# Patient Record
Sex: Male | Born: 1980
Health system: Southern US, Community
[De-identification: ages and names within clinical notes are randomized; demographics above are authoritative.]

## PROBLEM LIST (undated history)

## (undated) DIAGNOSIS — R55 Syncope and collapse: Secondary | ICD-10-CM

## (undated) DIAGNOSIS — F419 Anxiety disorder, unspecified: Secondary | ICD-10-CM

## (undated) DIAGNOSIS — E785 Hyperlipidemia, unspecified: Secondary | ICD-10-CM

## (undated) DIAGNOSIS — I319 Disease of pericardium, unspecified: Secondary | ICD-10-CM

## (undated) HISTORY — DX: Syncope and collapse: R55

## (undated) HISTORY — DX: Disease of pericardium, unspecified: I31.9

## (undated) HISTORY — DX: Anxiety disorder, unspecified: F41.9

## (undated) HISTORY — DX: Hyperlipidemia, unspecified: E78.5

## (undated) HISTORY — PX: TONSILLECTOMY: SUR1361

---
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
## 2017-04-29 ENCOUNTER — Other Ambulatory Visit: Payer: Self-pay

## 2017-04-29 ENCOUNTER — Emergency Department (INDEPENDENT_AMBULATORY_CARE_PROVIDER_SITE_OTHER)
Admission: EM | Admit: 2017-04-29 | Discharge: 2017-04-29 | Disposition: A | Discharge status: Home / Self Care | Payer: 59 | Source: Home / Self Care | Attending: Family Medicine | Admitting: Family Medicine

## 2017-04-29 ENCOUNTER — Encounter: Payer: Self-pay | Admitting: Emergency Medicine

## 2017-04-29 DIAGNOSIS — H5711 Ocular pain, right eye: Secondary | ICD-10-CM

## 2017-04-29 MED ORDER — AMOXICILLIN-POT CLAVULANATE 875-125 MG PO TABS: 1.0000 | ORAL_TABLET | Freq: Two times a day (BID) | ORAL | 0 refills | Status: DC

## 2017-04-29 NOTE — Discharge Instructions (Signed)
If eyelid swelling recurs, apply warm compress 2 to 3 times daily.  If swelling increases, begin Augmentin.

## 2017-04-29 NOTE — ED Triage Notes (Signed)
Right eye swollen and tender x 2 days, slight sore throat 2 days ago, better only scratchy now.

## 2017-04-29 NOTE — ED Provider Notes (Signed)
[facility]    CSN: [id number] Arrival date & time: 04/29/17  1203     History   Chief Complaint Chief Complaint  Patient presents with  . Eye Problem    HPI Jeremy Peck is a 36 y.o. male.   Two days ago patient developed fatigue, right headache, scratchy throat, and mild redness of his right eyelid.  No sinus congestion or cough.  The next morning his right eyelids were swollen, now resolved.  He denies changes in vision or foreign body sensation.  All symptoms now improved.   The history is provided by the patient.    History reviewed. No pertinent past medical history.  There are no active problems to display for this patient.   History reviewed. No pertinent surgical history.     Home Medications    Prior to Admission medications   Medication Sig Start Date End Date Taking? Authorizing Provider  cetirizine (ZYRTEC) 10 MG tablet Take 10 mg daily by mouth.   Yes [provider]  amoxicillin-clavulanate (AUGMENTIN) 875-125 MG tablet Take 1 tablet 2 (two) times daily by mouth. Take with food (Rx void after 05/07/17) 04/29/17   Cathren HarshBeese, Tera MaterStephen A, MD    Family History History reviewed. No pertinent family history.  Social History Social History   Tobacco Use  . Smoking status: Never Smoker  . Smokeless tobacco: Never Used  Substance Use Topics  . Alcohol use: Yes  . Drug use: No     Allergies   Patient has no known allergies.   Review of Systems Review of Systems   Physical Exam Triage Vital Signs ED Triage Vitals  Enc Vitals Group     BP 04/29/17 1300 128/85     Pulse Rate 04/29/17 1300 (!) 58     Resp --      Temp 04/29/17 1300 98.5 F (36.9 C)     Temp Source 04/29/17 1300 Oral     SpO2 04/29/17 1300 100 %     Weight 04/29/17 1301 197 lb (89.4 kg)     Height 04/29/17 1301 6\' 2"  (1.88 m)     Head Circumference --      Peak Flow --      Pain Score 04/29/17 1301 1     Pain Loc --      Pain Edu? --      Excl. in  GC? --    No data found.  Updated Vital Signs BP 128/85 (BP Location: Right Arm)   Pulse (!) 58   Temp 98.5 F (36.9 C) (Oral)   Ht 6\' 2"  (1.88 m)   Wt 197 lb (89.4 kg)   SpO2 100%   BMI 25.29 kg/m   Visual Acuity Right Eye Distance:   Left Eye Distance:   Bilateral Distance:    Right Eye Near:   Left Eye Near:    Bilateral Near:     Physical Exam Nursing notes and Vital Signs reviewed. Appearance:  Patient appears stated age, and in no acute distress Eyes:  Pupils are equal, round, and reactive to light and accomodation.  Extraocular movement is intact.  Conjunctivae are not inflamed.  No right lid swelling, erythema, or tenderness to palpation.  No right eye discharge.  Fundi benign.  Ears:  Canals normal.  Tympanic membranes normal.  Nose:  Normal turbinates.  No sinus tenderness.   Pharynx:  Normal Neck:  Supple. No adenopathy.   Lungs: Normal respiration. Heart:  Normal rate. Skin:  No rash  present.    UC Treatments / Results  Labs (all labs ordered are listed, but only abnormal results are displayed) Labs Reviewed - No data to display  EKG  EKG Interpretation None       Radiology No results found.  Procedures Procedures (including critical care time)  Medications Ordered in UC Medications - No data to display   Initial Impression / Assessment and Plan / UC Course  I have reviewed the triage vital signs and the nursing notes.  Pertinent labs & imaging results that were available during my care of the patient were reviewed by me and considered in my medical decision making (see chart for details).    Patient's symptoms appear to have improved significantly since onset. No evidence preseptal cellulitis today (?early viral URI) If eyelid swelling recurs, apply warm compress 2 to 3 times daily.  If swelling increases, begin Augmentin (given Rx to hold). Followup with ophthalmologist if not improving one week.    Final Clinical Impressions(s) / UC  Diagnoses   Final diagnoses:  Eye pain, right    ED Discharge Orders        Ordered    amoxicillin-clavulanate (AUGMENTIN) 875-125 MG tablet  2 times daily     04/29/17 1406          Lattie HawBeese, Bridgit Eynon A, MD 05/02/17 1324

## 2017-07-01 ENCOUNTER — Encounter: Payer: Self-pay | Admitting: Physician Assistant

## 2017-07-01 ENCOUNTER — Encounter (INDEPENDENT_AMBULATORY_CARE_PROVIDER_SITE_OTHER): Payer: Self-pay

## 2017-07-01 ENCOUNTER — Ambulatory Visit (INDEPENDENT_AMBULATORY_CARE_PROVIDER_SITE_OTHER): Payer: 59 | Admitting: Physician Assistant

## 2017-07-01 VITALS — BP 121/85 | HR 72 | Ht 74.0 in | Wt 203.0 lb

## 2017-07-01 DIAGNOSIS — F909 Attention-deficit hyperactivity disorder, unspecified type: Secondary | ICD-10-CM

## 2017-07-01 DIAGNOSIS — Z7689 Persons encountering health services in other specified circumstances: Secondary | ICD-10-CM | POA: Diagnosis not present

## 2017-07-01 DIAGNOSIS — F4010 Social phobia, unspecified: Secondary | ICD-10-CM

## 2017-07-01 DIAGNOSIS — F40243 Fear of flying: Secondary | ICD-10-CM | POA: Diagnosis not present

## 2017-07-01 MED ORDER — ALPRAZOLAM 1 MG PO TABS: ORAL_TABLET | ORAL | 0 refills | Status: DC

## 2017-07-01 MED ORDER — METHYLPHENIDATE HCL 10 MG PO TABS: 10.0000 mg | ORAL_TABLET | Freq: Two times a day (BID) | ORAL | 0 refills | Status: DC

## 2017-07-01 NOTE — Progress Notes (Signed)
HPI:                                                                Jeremy Peck is a 37 y.o. male who presents to [facility]: Primary [facility] Medicine today to establish care  Current concerns include: anxiety, inattention  Anxiety: patient reports long-standing history of "social anxiety" that he has managed with meditation and relaxation exercises. 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 He also has a phobia of flying and has an upcoming flight to [place]. Reports history of panic attacks.   GAD 7 : Generalized Anxiety Score 07/01/2017  Nervous, Anxious, on Edge 2  Control/stop worrying 3  Worry too much - different things 3  Trouble relaxing 1  Restless 2  Easily annoyed or irritable 3  Afraid - awful might happen 2  Total GAD 7 Score 16  Anxiety Difficulty Very difficult    Depression screen PHQ 2/9 07/01/2017  Decreased Interest 1  Down, Depressed, Hopeless 0  PHQ - 2 Score 1   ADHD: reports he was diagnosed around age 37 by his college psychologist. He used to be prescribed Adderall, but states he had insomnia and weight loss. He thinks he also tried Wellbutrin, but there was concern about possible hx of seizure. He has not taken medication since college. He states his anxiety symptoms were better managed when he was on Adderall.    Adult ADHD Self Report Scale (most recent)    Adult ADHD Self-Report Scale (ASRS-v1.1) Symptom Checklist - 07/01/17 1540      Part A   1. How often do you have trouble wrapping up the final details of a project, once the challenging parts have been done?  Very Often  (Abnormal)   2. How often do you have difficulty getting things done in order when you have to do a task that requires organization?  Sometimes  (Abnormal)     3. How often do you have problems  remembering appointments or obligations?  Sometimes  (Abnormal)   4. When you have a task that requires a lot of thought, how often do you avoid or delay getting started?  Sometimes    5. How often do you fidget or squirm with your hands or feet when you have to sit down for a long time?  Very Often  (Abnormal)   6. How often do you feel overly active and compelled to do things, like you were driven by a motor?  Often  (Abnormal)       Part B   7. How often do you make careless mistakes when you have to work on a boring or difficult project?  Often  (Abnormal)   8. How often do you have difficulty keeping your attention when you are doing boring or repetitive work?  Sometimes    9. How often do you have difficulty concentrating on what people say to you, even when they are speaking to you directly?  Often  (Abnormal)   10. How often do you misplace or have difficulty finding things at home or at work?  Sometimes  11. How often are you distracted by activity or noise around you?  Often  (Abnormal)   12. How often do you leave your seat in meetings or other situations in which you are expected to remain seated?  Sometimes  (Abnormal)     13. How often do you feel restless or fidgety?  Very Often  (Abnormal)   14. How often do you have difficulty unwinding and relaxing when you have time to yourself?  Sometimes    15. How often do you find yourself talking too much when you are in social situations?  Never  16. When you are in a conversation, how often do you find yourself finishing the sentences of the people you are talking to, before they can finish them themselves?  Sometimes  (Abnormal)     17. How often do you have difficulty waiting your turn in situations when turn taking is required?  Never  18. How often do you interrupt others when they are busy?  Never      Comment   How old were you when these problems first began to occur?  18         No flowsheet data found.    No past medical  history on file. No past surgical history on file. Social History   Tobacco Use  . Smoking status: Never Smoker  . Smokeless tobacco: Never Used  Substance Use Topics  . Alcohol use: Yes   family history is not on file.    ROS: Review of Systems  Psychiatric/Behavioral: Negative for depression, hallucinations, substance abuse and suicidal ideas. The patient is nervous/anxious.   All other systems reviewed and are negative.    Medications: No current outpatient medications on file.   No current facility-administered medications for this visit.    No Known Allergies     Objective:  BP 121/85   Pulse 72   Ht 6\' 2"  (1.88 m)   Wt 203 lb (92.1 kg)   BMI 26.06 kg/m  Gen:  alert, not ill-appearing, no distress, appropriate for age HEENT: head normocephalic without obvious abnormality, conjunctiva and cornea clear, trachea midline Pulm: Normal work of breathing, normal phonation Neuro: alert and oriented x 3, no tremor MSK: extremities atraumatic, normal gait and station Skin: intact, no rashes on exposed skin, no jaundice, no cyanosis Psych: well-groomed, cooperative, good eye contact, euthymic mood, anxious affect, speech is articulate, and thought processes clear and goal-directed    No results found for this or any previous visit (from the past 72 hour(s)). No results found.    Assessment and Plan: 37 y.o. male with   1. Encounter to establish care - reviewed PMH, PSH, PFH, medications and allergies - reviewed health maintenance - influenza and Tdap UTD per patient - negative PHQ2  2. Adult ADHD - ASRS score 17, starting low-dose Methylphenidate bid with plan to switch to Concerta once we identify TDD - methylphenidate (RITALIN) 10 MG tablet; Take 1 tablet (10 mg total) by mouth 2 (two) times daily.  Dispense: 60 tablet; Refill: 0  3. Flying phobia - ALPRAZolam (XANAX) 1 MG tablet; Take 1 tab PO 1 hour before flight as needed  Dispense: 10 tablet; Refill:  0  4. Social anxiety disorder - GAD7 score 16, severe - patient is reticent about a daily medication. We discussed benefits of CBT. Patient will check [email or web address] and we will provide a referral to counselor if needed   Patient education and anticipatory guidance given Patient agrees with treatment  plan Follow-up in 4 weeks for anxiety/ADHD as needed if symptoms worsen or fail to improve  I spent 25 minutes with this patient, greater than 50% was face-to-face time counseling regarding the above diagnoses  Levonne Hubertharley E. Sharron Petruska PA-C

## 2017-07-01 NOTE — Patient Instructions (Signed)
[email or web address]    Cognitive Behavioral Therapy Cognitive behavioral therapy (CBT) is a short-term, goal-oriented type of talk therapy. CBT can help you:  Identify patterns of thinking, feeling, and behaving that are causing you problems.  Decide how you want to think, feel, and respond to life events.  Set goals to change the beliefs and thoughts that cause you to act in ways that are not helpful for you.  Follow up on the changes that you make.  What are the different types of CBT? The different types of CBT include:  Dialectical behavioral therapy (DBT). This approach is often used in group therapy, and it aids a person in managing behavior by focusing on: ? Things that cause problems to start (triggers). ? Methods of self-calming. ? Re-evaluating thinking processes.  Mindfulness-based cognitive therapy. This approach involves focusing your attention, meditating, and developing awareness of the present moment (mindfulness).  Rational emotive behavior therapy. This approach uses rational thought to reframe your thinking so it is less judgmental. Your therapist may directly challenge your thought processes.  Stress inoculation training. This approach involves planning ahead for stressful situations by practicing new thoughts and behaviors. This planning can help you avoid going back to old actions.  Acceptance and commitment therapy (ACT). This approach focuses on accepting yourself as you are and practicing mindfulness. It helps you understand what you would like to change and how you can set goals in that direction.  What conditions is CBT used to treat? CBT may help to treat:  Mental health conditions, including: ? Depression. ? Anxiety. ? Bipolar disorder. ? Eating disorders. ? Post-traumatic stress disorder (PTSD). ? Obsessive-compulsive disorder (OCD).  Insomnia and other sleep disorders.  Pain.  Stress.  Coping with loss or grief.  Coping with a difficult  medical diagnosis or illness.  Relationship problems.  Emotional distress or shock (trauma).  How can CBT help me? CBT may:  Give you a chance to share your thoughts, feelings, problems, and fears in a safe space.  Help you focus on specific problems.  Give you homework that helps you put theory into practice. Homework may include keeping a journal or doing thinking exercises.  Help you become aware of your patterns of thinking, feeling, and behaving, and how those three patterns affect each other.  Change your thoughts so that you can change your behaviors.  Help you chose how you want to view the world.  Teach you planned coping skills and offer better ways to deal with stress and difficult situations.  To make the most of CBT, make sure you:  Find a licensed therapist whom you trust.  Take an active part in your therapy and do the homework that you are given.  Are honest about your problems.  Avoid skipping your therapy sessions.  Summary  Cognitive behavioral therapy (CBT) is a short-term, goal-oriented type of talk therapy.  CBT can help you become aware of your patterns and the relationships among your thoughts, feelings, and behavior.  CBT may help mental health conditions and other problems. This information is not intended to replace advice given to you by your health care provider. Make sure you discuss any questions you have with your health care provider. Document Released: 10/09/2016 Document Revised: 10/09/2016 Document Reviewed: 10/09/2016 Elsevier Interactive Patient Education  2018 [employer]  Social Anxiety Disorder, Adult Social anxiety disorder, previously called social phobia, is a mental disorder. People with social anxiety disorder often feel nervous, afraid, or embarrassed when they are around other people  in social situations. They worry that other people are judging or criticizing them for how they look, what they say, or how they  act. Social anxiety disorder is more than just occasional shyness or self-consciousness. It can cause severe emotional distress. It can interfere with daily life activities. Social anxiety disorder also may lead to alcohol or drug use and even suicide. Social anxiety disorder is a common mental disorder. It can develop at any time, but it usually starts in the teenage years. What are the causes? The cause of this condition is not known. It may involve genes that are passed through families. Stressful events may trigger anxiety. What increases the risk? This condition is more likely to develop in:  People who have a family history of anxiety disorders.  Women.  People who have a condition that makes them feel self-conscious or nervous, such as a stutter or a chronic disease.  What are the signs or symptoms? The main symptom of this condition is fear of being criticized or judged in social situations. You may be afraid to:  Speak in public.  Go shopping.  Use a public bathroom.  Eat at a restaurant.  Go to work.  Interact with unfamiliar people.  Extreme fear and anxiety may cause physical symptoms, including:  Blushing.  Racing heart.  Sweating.  Shaky hands or voice.  Confusion.  Light-headedness.  Upset stomach, diarrhea, or vomiting.  Shortness of breath.  How is this diagnosed? Your health care provider can diagnose this condition based on your history, symptoms, and behavior in social situations. Your health care provider may ask you about your use of alcohol or drugs, including prescription medicines. Your health care provider may refer you to a mental health specialist for further evaluation or treatment. How is this treated? Treatment for this condition may include:  Cognitive behavioral therapy. This type of talk therapy helps you learn to replace negative thoughts and behaviors with positive ones. This may include learning better coping skills and ways to  control anxiety.  Exposure therapy. You will be exposed to social situations that cause fear. The treatment starts with situations that you can manage. Over time, you will learn to manage harder situations.  Antidepressant medicines. These medicines may be used for a short time along with other therapies.  Beta blockers. These medicines may help to control anxiety.  Biofeedback. This process trains you to manage your body's response (physiological response) through breathing techniques and relaxation methods. You will work with a therapist while machines are used to monitor your physical symptoms.  Relaxation and coping techniques. These include deep breathing, self-talk, meditation, visual imagery, and yoga. Relaxation techniques help to keep you calm in social situations.  These treatments are often used in combination. Follow these instructions at home:  Take over-the-counter and prescription medicines only as told by your health care provider.  Practice relaxation and coping strategies as taught by your health care provider.  Return to social activities as suggested by your health care provider.  Keep all follow-up visits as told by your health care provider. This is important. Contact a health care provider if:  Your symptoms do not improve.  Your symptoms get worse.  You have signs of depression, such as: ? A persistently sad, cranky, or irritable mood. ? Loss of enjoyment in activities that used to bring you joy. ? Change in weight or eating. ? Changes in sleeping habits. ? Avoiding friends or family members. ? Loss of energy for normal tasks. ?  Feelings of guilt or worthlessness.  You become very isolated.  You find it very hard to speak or interact with others.  You are using drugs.  You are drinking more alcohol than normal. Get help right away if:  You self-harm.  You have suicidal thoughts. If you ever feel like you may hurt yourself or others, or have  thoughts about taking your own life, get help right away. You can go to your nearest emergency department or call:  Your local emergency services (911 in the [place]  A suicide crisis helpline, such as the National Suicide Prevention Lifeline at 774-543-3053. This is open 24 hours a day.  Summary  Social anxiety disorder may cause you to feel nervous, afraid, or embarrassed when you are around other people in social situations.  Social anxiety disorder is a common mental disorder. It can develop at any time, but it usually starts in the teenage years.  Treatment includes talk therapy, exposure therapy, medicines, biofeedback, relaxation techniques, or a combination of two or more treatments. This information is not intended to replace advice given to you by your health care provider. Make sure you discuss any questions you have with your health care provider. Document Released: 04/26/2005 Document Revised: 04/20/2016 Document Reviewed: 04/20/2016 Elsevier Interactive Patient Education  2018 [employer].

## 2017-08-05 ENCOUNTER — Encounter: Payer: Self-pay | Admitting: Physician Assistant

## 2017-08-05 ENCOUNTER — Ambulatory Visit (INDEPENDENT_AMBULATORY_CARE_PROVIDER_SITE_OTHER): Payer: 59 | Admitting: Physician Assistant

## 2017-08-05 VITALS — BP 126/85 | HR 78 | Wt 200.0 lb

## 2017-08-05 DIAGNOSIS — Z79899 Other long term (current) drug therapy: Secondary | ICD-10-CM

## 2017-08-05 DIAGNOSIS — Z131 Encounter for screening for diabetes mellitus: Secondary | ICD-10-CM | POA: Diagnosis not present

## 2017-08-05 DIAGNOSIS — Z1329 Encounter for screening for other suspected endocrine disorder: Secondary | ICD-10-CM

## 2017-08-05 DIAGNOSIS — Z13220 Encounter for screening for lipoid disorders: Secondary | ICD-10-CM | POA: Diagnosis not present

## 2017-08-05 DIAGNOSIS — I10 Essential (primary) hypertension: Secondary | ICD-10-CM

## 2017-08-05 DIAGNOSIS — F909 Attention-deficit hyperactivity disorder, unspecified type: Secondary | ICD-10-CM | POA: Diagnosis not present

## 2017-08-05 MED ORDER — METHYLPHENIDATE HCL ER 27 MG PO TB24: 27.0000 mg | ORAL_TABLET | Freq: Every day | ORAL | 0 refills | Status: DC

## 2017-08-05 NOTE — Progress Notes (Signed)
HPI:                                                                Jeremy Peck is a 37 y.o. male who presents to [facility]: Primary Care Sports Medicine today for ADHD/anxiety follow-up  ADHD: taking low-dose Methylphenidate 10 mg daily. He has noticed it only lasts about a half day. He will take a second afternoon dose on weekends when he is working 12+ hours. He feels his social anxiety has improved significantly since starting the stimulant. Denies headache, palpitations, insomnia, weight loss.    Depression screen PHQ 2/9 07/01/2017  Decreased Interest 1  Down, Depressed, Hopeless 0  PHQ - 2 Score 1    GAD 7 : Generalized Anxiety Score 07/01/2017  Nervous, Anxious, on Edge 2  Control/stop worrying 3  Worry too much - different things 3  Trouble relaxing 1  Restless 2  Easily annoyed or irritable 3  Afraid - awful might happen 2  Total GAD 7 Score 16  Anxiety Difficulty Very difficult      Past Medical History:  Diagnosis Date  . Anxiety   . Hypertension   . Pericarditis   . Syncope and collapse 2001   ? seizure   Past Surgical History:  Procedure Laterality Date  . TONSILLECTOMY     Social History   Tobacco Use  . Smoking status: Never Smoker  . Smokeless tobacco: Never Used  Substance Use Topics  . Alcohol use: Yes    Alcohol/week: 1.2 oz    Types: 2 Standard drinks or equivalent per week   family history includes Throat cancer in his father.    ROS: negative except as noted in the HPI  Medications: Current Outpatient Medications  Medication Sig Dispense Refill  . Cholecalciferol ([employer] VIT D 5000 HIGH-POTENCY) 5000 units capsule Take 5,000 Units by mouth daily.    Marland Kitchen CREATINE PO Take by mouth.    . methylphenidate 27 MG PO TB24 Take 1 tablet (27 mg total) by mouth daily with breakfast. 30 tablet 0   No current facility-administered medications for this visit.    No Known Allergies     Objective:  BP 126/85   Pulse 78    Wt 200 lb (90.7 kg)   BMI 25.68 kg/m  Gen:  alert, not ill-appearing, no distress, appropriate for age HEENT: head normocephalic without obvious abnormality, conjunctiva and cornea clear, trachea midline Pulm: Normal work of breathing, normal phonation, clear to auscultation bilaterally CV: Normal rate, regular rhythm, s1 and s2 distinct, no murmurs, clicks or rubs  Neuro: alert and oriented x 3, no tremor MSK: extremities atraumatic, normal gait and station Skin: intact, no rashes on exposed skin, no jaundice, no cyanosis Psych: well-groomed, cooperative, good eye contact, euthymic mood, affect mood-congruent, speech is articulate, and thought processes clear and goal-directed    No results found for this or any previous visit (from the past 72 hour(s)). No results found.    Assessment and Plan: 37 y.o. male with   1. Encounter for medication management   2. Adult ADHD - tolerating low-dose methylphenidate IR. He would like to try extended release. Counseled on monitoring BP's at home - methylphenidate 27 MG PO TB24; Take 1 tablet (27 mg total) by  mouth daily with breakfast.  Dispense: 30 tablet; Refill: 0  3. Hypertension goal BP (blood pressure) < 130/80 BP Readings from Last 3 Encounters:  08/05/17 126/85  07/01/17 121/85  04/29/17 128/85  - he has no additional CVD risk factors - counseled on therapeutic lifestyle changes - he will monitor BP's at home - active surveillance - Comprehensive metabolic panel, TSH pending  4. Screening for lipid disorders - Lipid Panel w/reflex Direct LDL  5. Screening for diabetes mellitus - Comprehensive metabolic panel  6. Screening for thyroid disorder - TSH + free T4  Patient education and anticipatory guidance given Patient agrees with treatment plan Follow-up in 3 months for medication management or sooner as needed if symptoms worsen or fail to improve  Levonne Hubertharley E. Cummings PA-C

## 2017-08-05 NOTE — Patient Instructions (Addendum)
For your blood pressure: - Goal <130/80 - monitor and log blood pressures at home - check around the same time each day in a relaxed setting - Limit salt to <2000 mg/day - Follow DASH eating plan - limit alcohol to 2 standard drinks per day for men and 1 per day for women - avoid tobacco products - weight loss: 7% of current body weight - follow-up every 6 months for your blood pressure   I have also ordered fasting labs. The lab is a walk-in open M-F 7:30a-4:30p (closed 12:30-1:30p). Nothing to eat or drink after midnight or at least 8 hours before your blood draw. You can have water and your medications.

## 2017-08-08 ENCOUNTER — Telehealth: Payer: Self-pay | Admitting: Physician Assistant

## 2017-08-08 DIAGNOSIS — Z131 Encounter for screening for diabetes mellitus: Secondary | ICD-10-CM | POA: Diagnosis not present

## 2017-08-08 DIAGNOSIS — Z13220 Encounter for screening for lipoid disorders: Secondary | ICD-10-CM | POA: Diagnosis not present

## 2017-08-08 DIAGNOSIS — Z1329 Encounter for screening for other suspected endocrine disorder: Secondary | ICD-10-CM | POA: Diagnosis not present

## 2017-08-08 DIAGNOSIS — I10 Essential (primary) hypertension: Secondary | ICD-10-CM | POA: Diagnosis not present

## 2017-08-08 NOTE — Telephone Encounter (Signed)
Received fax from the pharmacy that Concerta 27 MG was approved from 08/07/2017 until 08/06/2018. Pharmacy notified. Patient notified as well. Forms sent to scan.   Per Genevie CheshireBilly at [facility] patient was quoted the estimate and he is willing to pay the difference that insurance did not pay at this time. Patient reports he may want to switch pharmacies in the future.   Reference: [id number].

## 2017-08-09 ENCOUNTER — Other Ambulatory Visit: Payer: Self-pay | Admitting: Physician Assistant

## 2017-08-09 ENCOUNTER — Encounter: Payer: Self-pay | Admitting: Physician Assistant

## 2017-08-09 DIAGNOSIS — E782 Mixed hyperlipidemia: Secondary | ICD-10-CM

## 2017-08-09 LAB — LIPID PANEL W/REFLEX DIRECT LDL
Cholesterol: 239 mg/dL — ABNORMAL HIGH (ref <200)
HDL: 38 mg/dL — ABNORMAL LOW (ref >40)
LDL Cholesterol (Calc): 174 mg/dL (calc) — ABNORMAL HIGH
Non-HDL Cholesterol (Calc): 201 mg/dL (calc) — ABNORMAL HIGH (ref <130)
TRIGLYCERIDES: 135 mg/dL (ref <150)
Total CHOL/HDL Ratio: 6.3 (calc) — ABNORMAL HIGH (ref <5.0)

## 2017-08-09 LAB — COMPREHENSIVE METABOLIC PANEL
AG RATIO: 1.8 (calc) (ref 1.0–2.5)
ALT: 29 U/L (ref 9–46)
AST: 22 U/L (ref 10–40)
Albumin: 4.9 g/dL (ref 3.6–5.1)
Alkaline phosphatase (APISO): 64 U/L (ref 40–115)
BUN: 17 mg/dL (ref 7–25)
CO2: 30 mmol/L (ref 20–32)
Calcium: 9.8 mg/dL (ref 8.6–10.3)
Chloride: 103 mmol/L (ref 98–110)
Creat: 1.11 mg/dL (ref 0.60–1.35)
GLOBULIN: 2.7 g/dL (ref 1.9–3.7)
GLUCOSE: 93 mg/dL (ref 65–99)
Potassium: 4.1 mmol/L (ref 3.5–5.3)
SODIUM: 141 mmol/L (ref 135–146)
TOTAL PROTEIN: 7.6 g/dL (ref 6.1–8.1)
Total Bilirubin: 0.6 mg/dL (ref 0.2–1.2)

## 2017-08-09 LAB — TSH+FREE T4: TSH W/REFLEX TO FT4: 2 mIU/L (ref 0.40–4.50)

## 2017-08-09 MED ORDER — ATORVASTATIN CALCIUM 20 MG PO TABS: 20.0000 mg | ORAL_TABLET | Freq: Every day | ORAL | 3 refills | Status: DC

## 2017-08-09 NOTE — Progress Notes (Signed)
Starting Atorvastatin for mixed HLD (LDL 176) Recheck fasting lipids in 3 months

## 2017-08-09 NOTE — Progress Notes (Signed)
Good afternoon Jeremy Peck,  Your LDL cholesterol is very high (174). When LDL is above 160, cholesterol-lowering medication is recommended in addition to low-fat diet and aerobic exercise (at least 2 hours per week).  Your other labs look great.  Best, Vinetta Bergamoharley

## 2017-09-06 ENCOUNTER — Other Ambulatory Visit: Payer: Self-pay | Admitting: Physician Assistant

## 2017-09-06 DIAGNOSIS — F909 Attention-deficit hyperactivity disorder, unspecified type: Secondary | ICD-10-CM

## 2017-09-09 NOTE — Telephone Encounter (Signed)
PCP can do this tomorrow.

## 2017-09-10 MED ORDER — METHYLPHENIDATE HCL ER 27 MG PO TB24: 27.0000 mg | ORAL_TABLET | Freq: Every day | ORAL | 0 refills | Status: DC

## 2017-09-11 ENCOUNTER — Encounter: Payer: Self-pay | Admitting: Physician Assistant

## 2017-09-20 ENCOUNTER — Encounter: Payer: Self-pay | Admitting: Physician Assistant

## 2017-09-20 MED FILL — ATORVASTATIN 20 MG TABLET: 20 | 90 days supply | Qty: 90 | Fill #0

## 2017-11-05 ENCOUNTER — Ambulatory Visit (INDEPENDENT_AMBULATORY_CARE_PROVIDER_SITE_OTHER): Payer: 59 | Admitting: Physician Assistant

## 2017-11-05 ENCOUNTER — Encounter: Payer: Self-pay | Admitting: Physician Assistant

## 2017-11-05 VITALS — BP 117/79 | HR 70 | Wt 191.0 lb

## 2017-11-05 DIAGNOSIS — I10 Essential (primary) hypertension: Secondary | ICD-10-CM

## 2017-11-05 DIAGNOSIS — Z79899 Other long term (current) drug therapy: Secondary | ICD-10-CM

## 2017-11-05 DIAGNOSIS — F909 Attention-deficit hyperactivity disorder, unspecified type: Secondary | ICD-10-CM | POA: Diagnosis not present

## 2017-11-05 DIAGNOSIS — Z5181 Encounter for therapeutic drug level monitoring: Secondary | ICD-10-CM | POA: Diagnosis not present

## 2017-11-05 DIAGNOSIS — E782 Mixed hyperlipidemia: Secondary | ICD-10-CM

## 2017-11-05 MED ORDER — METHYLPHENIDATE HCL 10 MG PO TABS: 10.0000 mg | ORAL_TABLET | Freq: Two times a day (BID) | ORAL | 0 refills | Status: DC

## 2017-11-05 NOTE — Progress Notes (Signed)
HPI:                                                                Jeremy Peck is a 37 y.o. male who presents to [facility]: [facility] Care [facility] Medicine today for medication management  ADHD: currently taking Concerta 27 mg daily.  Having headaches most days in the afternoon.  Denies loss of appetite or sleep disturbance.  Denies palpitations, abnormal/skipped beats, or chest pain Has been monitoring BP at home. Range 107-126/64-79  Anxiety: reports he is doing a CBT app and this is helping a lot.  HLD: taking Atorvastatin 20 mg. Compliant with medication. Denies myalgias.  Depression screen PHQ 2/9 07/01/2017  Decreased Interest 1  Down, Depressed, Hopeless 0  PHQ - 2 Score 1    GAD 7 : Generalized Anxiety Score 07/01/2017  Nervous, Anxious, on Edge 2  Control/stop worrying 3  Worry too much - different things 3  Trouble relaxing 1  Restless 2  Easily annoyed or irritable 3  Afraid - awful might happen 2  Total GAD 7 Score 16  Anxiety Difficulty Very difficult      Past Medical History:  Diagnosis Date  . Anxiety   . Hypertension   . Pericarditis   . Syncope and collapse 2001   ? seizure   Past Surgical History:  Procedure Laterality Date  . TONSILLECTOMY     Social History   Tobacco Use  . Smoking status: Never Smoker  . Smokeless tobacco: Never Used  Substance Use Topics  . Alcohol use: Yes    Alcohol/week: 1.2 oz    Types: 2 Standard drinks or equivalent per week   family history includes Alcohol abuse in his brother; Heart block in his brother; Throat cancer in his father.    ROS: negative except as noted in the HPI  Medications: Current Outpatient Medications  Medication Sig Dispense Refill  . atorvastatin (LIPITOR) 20 MG tablet Take 1 tablet (20 mg total) by mouth at bedtime. 90 tablet 3  . Cholecalciferol ([facility] VIT D 5000 HIGH-POTENCY) 5000 units capsule Take 5,000 Units by mouth daily.    Marland Kitchen CREATINE PO Take by  mouth.    . methylphenidate (RITALIN) 10 MG tablet Take 1 tablet (10 mg total) by mouth 2 (two) times daily with breakfast and lunch. 60 tablet 0   No current facility-administered medications for this visit.    No Known Allergies     Objective:  BP 117/79   Pulse 70   Wt 191 lb (86.6 kg)   BMI 24.52 kg/m  Gen:  alert, not ill-appearing, no distress, appropriate for age HEENT: head normocephalic without obvious abnormality, conjunctiva and cornea clear, wearing glasses, trachea midline Pulm: Normal work of breathing, normal phonation, clear to auscultation bilaterally, no wheezes, rales or rhonchi CV: Normal rate, regular rhythm, s1 and s2 distinct, no murmurs, clicks or rubs; radial pulses 2+ symmetric Neuro: alert and oriented x 3, no tremor MSK: extremities atraumatic, normal gait and station Skin: intact, no rashes on exposed skin, no jaundice, no cyanosis Psych: well-groomed, cooperative, good eye contact, euthymic mood, affect mood-congruent, speech is articulate, and thought processes clear and goal-directed    No results found for this or any previous visit (from the past  72 hour(s)). No results found.    Assessment and Plan: 36 y.o. male with   Encounter for medication management in attention deficit hyperactivity disorder (ADHD)  Adult ADHD - Plan: methylphenidate (RITALIN) 10 MG tablet  Mixed hyperlipidemia - Plan: Lipid Panel w/reflex Direct LDL  Encounter for monitoring statin therapy - Plan: Lipid Panel w/reflex Direct LDL  ADHD - did not tolerate Concerta (increased headaches). Switching make to Methylphenidate 10 mg. He will continue to take medication holidays on days off. He can choose to take the afternoon dose as needed on longer shifts  HLD - last LDL>160. Cont moderate intensity statin - rechecking fasting lipids Lab Results  Component Value Date   CHOL 239 (H) 08/08/2017   HDL 38 (L) 08/08/2017   LDLCALC 174 (H) 08/08/2017   TRIG 135  08/08/2017   CHOLHDL 6.3 (H) 08/08/2017    HTN BP Readings from Last 3 Encounters:  11/05/17 117/79  08/05/17 126/85  07/01/17 121/85  - diet-controlled. He has lost 12 pounds over the last 4 months - BP in range today    Patient education and anticipatory guidance given Patient agrees with treatment plan Follow-up as needed if symptoms worsen or fail to improve  Levonne Hubert PA-C

## 2017-11-06 ENCOUNTER — Encounter: Payer: Self-pay | Admitting: Physician Assistant

## 2017-11-06 DIAGNOSIS — Z5181 Encounter for therapeutic drug level monitoring: Secondary | ICD-10-CM | POA: Insufficient documentation

## 2017-11-06 DIAGNOSIS — Z79899 Other long term (current) drug therapy: Secondary | ICD-10-CM

## 2017-12-11 ENCOUNTER — Other Ambulatory Visit: Payer: Self-pay | Admitting: Physician Assistant

## 2017-12-11 DIAGNOSIS — F909 Attention-deficit hyperactivity disorder, unspecified type: Secondary | ICD-10-CM

## 2017-12-11 MED ORDER — METHYLPHENIDATE HCL 10 MG PO TABS: 10.0000 mg | ORAL_TABLET | Freq: Two times a day (BID) | ORAL | 0 refills | Status: DC

## 2018-01-25 ENCOUNTER — Other Ambulatory Visit: Payer: Self-pay | Admitting: Physician Assistant

## 2018-01-25 DIAGNOSIS — F909 Attention-deficit hyperactivity disorder, unspecified type: Secondary | ICD-10-CM

## 2018-01-29 MED ORDER — METHYLPHENIDATE HCL 10 MG PO TABS: 10.0000 mg | ORAL_TABLET | Freq: Two times a day (BID) | ORAL | 0 refills | Status: DC

## 2018-02-05 ENCOUNTER — Ambulatory Visit: Payer: 59 | Admitting: Physician Assistant

## 2018-03-26 ENCOUNTER — Encounter: Payer: Self-pay | Admitting: Physician Assistant

## 2018-03-26 ENCOUNTER — Other Ambulatory Visit: Payer: Self-pay | Admitting: Physician Assistant

## 2018-03-26 ENCOUNTER — Ambulatory Visit (INDEPENDENT_AMBULATORY_CARE_PROVIDER_SITE_OTHER): Payer: 59 | Admitting: Physician Assistant

## 2018-03-26 VITALS — BP 127/84 | HR 71 | Wt 204.0 lb

## 2018-03-26 DIAGNOSIS — E782 Mixed hyperlipidemia: Secondary | ICD-10-CM | POA: Diagnosis not present

## 2018-03-26 DIAGNOSIS — R030 Elevated blood-pressure reading, without diagnosis of hypertension: Secondary | ICD-10-CM | POA: Insufficient documentation

## 2018-03-26 DIAGNOSIS — F909 Attention-deficit hyperactivity disorder, unspecified type: Secondary | ICD-10-CM

## 2018-03-26 DIAGNOSIS — M7742 Metatarsalgia, left foot: Secondary | ICD-10-CM | POA: Diagnosis not present

## 2018-03-26 DIAGNOSIS — Z79899 Other long term (current) drug therapy: Secondary | ICD-10-CM | POA: Insufficient documentation

## 2018-03-26 MED ORDER — METHYLPHENIDATE HCL 10 MG PO TABS: 10.0000 mg | ORAL_TABLET | Freq: Two times a day (BID) | ORAL | 0 refills | Status: DC

## 2018-03-26 NOTE — Progress Notes (Signed)
HPI:                                                                Jeremy Peck is a 37 y.o. male who presents to [facility]: Primary Care Sports Medicine today for medication mgmt  ADHD: he was switched from Concerta to Ritalin due to headaches. Headaches have improved. He takes Ritalin 10 mg daily on weekdays and twice daily on weekends at work (works 12-15 hr shifts). Denies loss of appetite or sleep disturbance.  Denies palpitations, abnormal/skipped beats, or chest pain He is no longer been monitoring BP at home. Previous home BP range 107-126/64-79  Anxiety: reports he is doing a CBT app ([employer]) weekly and this is working well for him. He is also meditating regularly. He states it is hard for him to come to doctor due to anxiety and he thinks this is why his BP is high.   HLD: taking Atorvastatin 20 mg. Not compliant with medication, reports he fell out of his routine. Denies myalgias.  He also reports he has had pain of the plantar aspect of his left foot between the first and second toes. This began after vacation in which he was walking barefoot regularly. He changed his shoes and the pain improved significantly. He is still able to reproduce the pain if he presses firmly on that area. Denies deformity, redness, warmth or foreign body.  Depression screen [facility] 2/9 03/26/2018 07/01/2017  Decreased Interest 0 1  Down, Depressed, Hopeless 0 0  PHQ - 2 Score 0 1  Altered sleeping 0 -  Tired, decreased energy 1 -  Change in appetite 0 -  Feeling bad or failure about yourself  0 -  Trouble concentrating 1 -  Moving slowly or fidgety/restless 1 -  Suicidal thoughts 0 -  PHQ-9 Score 3 -    GAD 7 : Generalized Anxiety Score 03/26/2018 07/01/2017  Nervous, Anxious, on Edge 1 2  Control/stop worrying 0 3  Worry too much - different things 1 3  Trouble relaxing 0 1  Restless 0 2  Easily annoyed or irritable 1 3  Afraid - awful might happen 0 2  Total GAD  7 Score 3 16  Anxiety Difficulty - Very difficult      Past Medical History:  Diagnosis Date  . Anxiety   . Hypertension   . Pericarditis   . Syncope and collapse 2001   ? seizure   Past Surgical History:  Procedure Laterality Date  . TONSILLECTOMY     Social History   Tobacco Use  . Smoking status: Never Smoker  . Smokeless tobacco: Never Used  Substance Use Topics  . Alcohol use: Yes    Alcohol/week: 2.0 standard drinks    Types: 2 Standard drinks or equivalent per week   family history includes Alcohol abuse in his brother; Heart block in his brother; Throat cancer in his father.    ROS: negative except as noted in the HPI  Medications: Current Outpatient Medications  Medication Sig Dispense Refill  . atorvastatin (LIPITOR) 20 MG tablet Take 1 tablet (20 mg total) by mouth at bedtime. 90 tablet 3  . Cholecalciferol ([facility] VIT D 5000 HIGH-POTENCY) 5000 units capsule Take 5,000 Units by mouth daily.    Marland Kitchen  CREATINE PO Take by mouth.    . methylphenidate (RITALIN) 10 MG tablet Take 1 tablet (10 mg total) by mouth 2 (two) times daily with breakfast and lunch. 60 tablet 0   No current facility-administered medications for this visit.    No Known Allergies     Objective:  BP 127/84   Pulse 71   Wt 204 lb (92.5 kg)   BMI 26.19 kg/m  Gen:  alert, not ill-appearing, no distress, appropriate for age HEENT: head normocephalic without obvious abnormality, conjunctiva and cornea clear, wearing glasses, trachea midline Pulm: Normal work of breathing, normal phonation, clear to auscultation bilaterally, no wheezes, rales or rhonchi CV: Normal rate, regular rhythm, s1 and s2 distinct, no murmurs, clicks or rubs  Neuro: alert and oriented x 3, no tremor MSK: extremities atraumatic, normal gait and station Left foot: atraumatic, neurovascularly intact, tenderness of the distal 2nd metatarsal head Skin: intact, no rashes on exposed skin, no jaundice, no cyanosis Psych:  well-groomed, cooperative, good eye contact, euthymic mood, affect mood-congruent, speech is articulate, and thought processes clear and goal-directed    No results found for this or any previous visit (from the past 72 hour(s)). No results found.    Assessment and Plan: 37 y.o. male with   .Taite was seen today for follow-up.  Diagnoses and all orders for this visit:  Encounter for long-term (current) use of medications  White coat syndrome without diagnosis of hypertension  Adult ADHD -     methylphenidate (RITALIN) 10 MG tablet; Take 1 tablet (10 mg total) by mouth 2 (two) times daily with breakfast and lunch.  Mixed hyperlipidemia  Metatarsalgia, left foot    Encouraged to continue monitoring BP at home intermittently. Suspect this is white coat syndrome.  Encouraged to avoid barefoot walking, wear supportive shoes, and f/u with sports medicine prn for custom orthotics   Patient education and anticipatory guidance given Patient agrees with treatment plan Follow-up in 6 months or sooner as needed if symptoms worsen or fail to improve  Levonne Hubert PA-C

## 2018-03-27 MED ORDER — METHYLPHENIDATE HCL 10 MG PO TABS: 10.0000 mg | ORAL_TABLET | Freq: Two times a day (BID) | ORAL | 0 refills | Status: DC

## 2018-05-07 ENCOUNTER — Other Ambulatory Visit: Payer: Self-pay | Admitting: Physician Assistant

## 2018-05-07 DIAGNOSIS — F909 Attention-deficit hyperactivity disorder, unspecified type: Secondary | ICD-10-CM

## 2018-05-07 MED ORDER — METHYLPHENIDATE HCL 10 MG PO TABS: 10.0000 mg | ORAL_TABLET | Freq: Two times a day (BID) | ORAL | 0 refills | Status: DC

## 2018-05-16 ENCOUNTER — Other Ambulatory Visit: Payer: Self-pay

## 2018-05-16 DIAGNOSIS — E782 Mixed hyperlipidemia: Secondary | ICD-10-CM

## 2018-05-16 MED ORDER — ATORVASTATIN CALCIUM 20 MG PO TABS: 20.0000 mg | ORAL_TABLET | Freq: Every day | ORAL | 0 refills | Status: DC

## 2018-07-07 ENCOUNTER — Other Ambulatory Visit: Payer: Self-pay | Admitting: Physician Assistant

## 2018-07-07 DIAGNOSIS — F909 Attention-deficit hyperactivity disorder, unspecified type: Secondary | ICD-10-CM

## 2018-07-09 MED ORDER — METHYLPHENIDATE HCL 10 MG PO TABS: 10.0000 mg | ORAL_TABLET | Freq: Two times a day (BID) | ORAL | 0 refills | Status: DC

## 2018-09-06 ENCOUNTER — Other Ambulatory Visit: Payer: Self-pay | Admitting: Physician Assistant

## 2018-09-06 DIAGNOSIS — E782 Mixed hyperlipidemia: Secondary | ICD-10-CM

## 2018-09-25 ENCOUNTER — Telehealth (INDEPENDENT_AMBULATORY_CARE_PROVIDER_SITE_OTHER): Payer: 59 | Admitting: Physician Assistant

## 2018-09-25 ENCOUNTER — Encounter: Payer: Self-pay | Admitting: Physician Assistant

## 2018-09-25 VITALS — BP 118/78 | HR 75 | Temp 97.0°F | Wt 203.0 lb

## 2018-09-25 DIAGNOSIS — F4010 Social phobia, unspecified: Secondary | ICD-10-CM

## 2018-09-25 DIAGNOSIS — E782 Mixed hyperlipidemia: Secondary | ICD-10-CM | POA: Diagnosis not present

## 2018-09-25 DIAGNOSIS — F909 Attention-deficit hyperactivity disorder, unspecified type: Secondary | ICD-10-CM | POA: Diagnosis not present

## 2018-09-25 DIAGNOSIS — Z79899 Other long term (current) drug therapy: Secondary | ICD-10-CM

## 2018-09-25 MED ORDER — ATORVASTATIN CALCIUM 20 MG PO TABS: 20.0000 mg | ORAL_TABLET | Freq: Every day | ORAL | 0 refills | Status: DC

## 2018-09-25 MED ORDER — METHYLPHENIDATE HCL 10 MG PO TABS: 10.0000 mg | ORAL_TABLET | Freq: Two times a day (BID) | ORAL | 0 refills | Status: DC

## 2018-09-25 NOTE — Progress Notes (Signed)
Virtual Visit via Video Note  I connected with Jeremy Peck on 09/25/18 at  1:40 PM EDT by a video enabled telemedicine application and verified that I am speaking with the correct person using two identifiers.   I discussed the limitations of evaluation and management by telemedicine and the availability of in person appointments. The patient expressed understanding and agreed to proceed.  History of Present Illness: HPI:                                                                Jeremy SeverinCharles G Cercone is a 38 y.o. male   CC: refills  ADHD:  He takes Ritalin 10 mg daily on weekdays and twice daily on weekends at work (works 12-15 hr shifts). Denies mood changes or sleep disturbance.  Denies palpitations, abnormal/skipped beats, or chest pain   Anxiety: currently managing with mindfulness/lifestyle changes and alprazolam prn. He states it is hard for him to come to doctor due to anxiety and he thinks this is why his BP is high.  States his counselor had said he may need medication. He does not want to take medication at this time. Has not had a panic attack or needed alprazolam for several months. Reports he was having relationship issues that was contributing to his anxiety and this has resolved. He is exercising 3 days per week and this has been helpful Sleep is restorative. Denies depressed mood/anhedonia   HLD: taking Atorvastatin 20 mg. Denies myalgias or GI upset.     Past Medical History:  Diagnosis Date  . Anxiety   . Hypertension   . Pericarditis   . Syncope and collapse 2001   ? seizure   Past Surgical History:  Procedure Laterality Date  . TONSILLECTOMY     Social History   Tobacco Use  . Smoking status: Never Smoker  . Smokeless tobacco: Never Used  Substance Use Topics  . Alcohol use: Yes    Alcohol/week: 2.0 standard drinks    Types: 2 Standard drinks or equivalent per week   family history includes Alcohol abuse in his brother; Heart block in his  brother; Throat cancer in his father.    ROS: negative except as noted in the HPI  Medications: Current Outpatient Medications  Medication Sig Dispense Refill  . atorvastatin (LIPITOR) 20 MG tablet Take 1 tablet (20 mg total) by mouth daily at 6 PM. Dur for labs in May 60 tablet 0  . Cholecalciferol ([facility] VIT D 5000 HIGH-POTENCY) 5000 units capsule Take 5,000 Units by mouth daily.    . methylphenidate (RITALIN) 10 MG tablet Take 1 tablet (10 mg total) by mouth 2 (two) times daily with breakfast and lunch. 60 tablet 0  . CREATINE PO Take by mouth.     No current facility-administered medications for this visit.    No Known Allergies     Objective:  BP 118/78   Pulse 75   Temp (!) 97 F (36.1 C) (Oral)   Wt 180 lb (81.6 kg)   BMI 23.11 kg/m  Gen:  alert, not ill-appearing, no distress, appropriate for age HEENT: head normocephalic without obvious abnormality, conjunctiva and cornea clear, trachea midline Pulm: Normal work of breathing, normal phonation Neuro: alert and oriented x 3 Psych: cooperative, euthymic mood, affect  mood-congruent, speech is articulate, normal rate and volume; thought processes clear and goal-directed, normal judgment, good insight   BP Readings from Last 3 Encounters:  09/25/18 118/78  03/26/18 127/84  11/05/17 117/79    Wt Readings from Last 3 Encounters:  09/25/18 180 lb (81.6 kg)  03/26/18 204 lb (92.5 kg)  11/05/17 191 lb (86.6 kg)    Lab Results  Component Value Date   CREATININE 1.11 08/08/2017   BUN 17 08/08/2017   NA 141 08/08/2017   K 4.1 08/08/2017   CL 103 08/08/2017   CO2 30 08/08/2017   Lab Results  Component Value Date   ALT 29 08/08/2017   AST 22 08/08/2017   BILITOT 0.6 08/08/2017   No results found for: WBC, HGB, HCT, MCV, PLT Lab Results  Component Value Date   CHOL 239 (H) 08/08/2017   HDL 38 (L) 08/08/2017   LDLCALC 174 (H) 08/08/2017   TRIG 135 08/08/2017   CHOLHDL 6.3 (H) 08/08/2017      Assessment  and Plan: 38 y.o. male with   .Diagnoses and all orders for this visit:  Encounter for long-term (current) use of medications  Adult ADHD -     Discontinue: methylphenidate (RITALIN) 10 MG tablet; Take 1 tablet (10 mg total) by mouth 2 (two) times daily with breakfast and lunch.  Social anxiety disorder  Mixed hyperlipidemia -     atorvastatin (LIPITOR) 20 MG tablet; Take 1 tablet (20 mg total) by mouth daily at 6 PM. Dur for labs in May   Patient provided vital signs reviewed Physical exam limited by video visit today Refills provided Due for routine fasting labs, deferred today due to COVID-19 pandemic  Follow-up in office in 6 months  Follow Up Instructions:    I discussed the assessment and treatment plan with the patient. The patient was provided an opportunity to ask questions and all were answered. The patient agreed with the plan and demonstrated an understanding of the instructions.   The patient was advised to call back or seek an in-person evaluation if the symptoms worsen or if the condition fails to improve as anticipated.  I provided 10 minutes of non-face-to-face time during this encounter.   Carlis Stable, [place]

## 2018-09-25 NOTE — Patient Instructions (Signed)
Social Anxiety Disorder, Adult Social anxiety disorder, previously called social phobia, is a mental disorder. People with social anxiety disorder often feel nervous, afraid, or embarrassed when they are around other people in social situations. They worry that other people are judging or criticizing them for how they look, what they say, or how they act. Social anxiety disorder is more than just occasional shyness or self-consciousness. It can cause severe emotional distress. It can interfere with daily life activities. Social anxiety disorder also may lead to alcohol or drug use and even suicide. Social anxiety disorder is a common mental disorder. It can develop at any time, but it usually starts in the teenage years. What are the causes? The cause of this condition is not known. It may involve genes that are passed through families. Stressful events may trigger anxiety. What increases the risk? This condition is more likely to develop in:  People who have a family history of anxiety disorders.  Women.  People who have a condition that makes them feel self-conscious or nervous, such as a stutter or a chronic disease. What are the signs or symptoms? The main symptom of this condition is fear of being criticized or judged in social situations. You may be afraid to:  Speak in public.  Go shopping.  Use a public bathroom.  Eat at a restaurant.  Go to work.  Interact with unfamiliar people. Extreme fear and anxiety may cause physical symptoms, including:  Blushing.  Racing heart.  Sweating.  Shaky hands or voice.  Confusion.  Light-headedness.  Upset stomach, diarrhea, or vomiting.  Shortness of breath. How is this diagnosed? Your health care provider can diagnose this condition based on your history, symptoms, and behavior in social situations. Your health care provider may ask you about your use of alcohol or drugs, including prescription medicines. Your health care  provider may refer you to a mental health specialist for further evaluation or treatment. How is this treated? Treatment for this condition may include:  Cognitive behavioral therapy. This type of talk therapy helps you learn to replace negative thoughts and behaviors with positive ones. This may include learning better coping skills and ways to control anxiety.  Exposure therapy. You will be exposed to social situations that cause fear. The treatment starts with situations that you can manage. Over time, you will learn to manage harder situations.  Antidepressant medicines. These medicines may be used for a short time along with other therapies.  Beta blockers. These medicines may help to control anxiety.  Biofeedback. This process trains you to manage your body's response (physiological response) through breathing techniques and relaxation methods. You will work with a therapist while machines are used to monitor your physical symptoms.  Relaxation and coping techniques. These include deep breathing, self-talk, meditation, visual imagery, and yoga. Relaxation techniques help to keep you calm in social situations. These treatments are often used in combination. Follow these instructions at home:  Take over-the-counter and prescription medicines only as told by your health care provider.  Practice relaxation and coping strategies as taught by your health care provider.  Return to social activities as suggested by your health care provider.  Keep all follow-up visits as told by your health care provider. This is important. Contact a health care provider if:  Your symptoms do not improve.  Your symptoms get worse.  You have signs of depression, such as: ? A persistently sad, cranky, or irritable mood. ? Loss of enjoyment in activities that used to bring   you joy. ? Change in weight or eating. ? Changes in sleeping habits. ? Avoiding friends or family members. ? Loss of energy for  normal tasks. ? Feelings of guilt or worthlessness.  You become very isolated.  You find it very hard to speak or interact with others.  You are using drugs.  You are drinking more alcohol than normal. Get help right away if:  You self-harm.  You have suicidal thoughts. If you ever feel like you may hurt yourself or others, or have thoughts about taking your own life, get help right away. You can go to your nearest emergency department or call:  Your local emergency services (911 in the [place]  A suicide crisis helpline, such as the National Suicide Prevention Lifeline at 1-800-273-8255. This is open 24 hours a day. Summary  Social anxiety disorder may cause you to feel nervous, afraid, or embarrassed when you are around other people in social situations.  Social anxiety disorder is a common mental disorder. It can develop at any time, but it usually starts in the teenage years.  Treatment includes talk therapy, exposure therapy, medicines, biofeedback, relaxation techniques, or a combination of two or more treatments. This information is not intended to replace advice given to you by your health care provider. Make sure you discuss any questions you have with your health care provider. Document Released: 04/26/2005 Document Revised: 04/20/2016 Document Reviewed: 04/20/2016 Elsevier Interactive Patient Education  2019 [employer]  

## 2018-11-14 ENCOUNTER — Other Ambulatory Visit: Payer: Self-pay | Admitting: Physician Assistant

## 2018-11-14 DIAGNOSIS — F909 Attention-deficit hyperactivity disorder, unspecified type: Secondary | ICD-10-CM

## 2018-11-15 ENCOUNTER — Other Ambulatory Visit: Payer: Self-pay | Admitting: Physician Assistant

## 2018-11-15 DIAGNOSIS — F909 Attention-deficit hyperactivity disorder, unspecified type: Secondary | ICD-10-CM

## 2018-11-17 MED ORDER — METHYLPHENIDATE HCL 10 MG PO TABS: 10.0000 mg | ORAL_TABLET | Freq: Two times a day (BID) | ORAL | 0 refills | Status: DC

## 2018-11-21 ENCOUNTER — Encounter: Payer: Self-pay | Admitting: Physician Assistant

## 2018-11-21 ENCOUNTER — Other Ambulatory Visit: Payer: Self-pay | Admitting: Physician Assistant

## 2018-11-21 ENCOUNTER — Other Ambulatory Visit: Payer: Self-pay

## 2018-11-21 DIAGNOSIS — F40243 Fear of flying: Secondary | ICD-10-CM

## 2018-11-21 MED ORDER — ALPRAZOLAM 1 MG PO TABS: 1.0000 mg | ORAL_TABLET | Freq: Two times a day (BID) | ORAL | 0 refills | Status: DC | PRN

## 2018-11-21 NOTE — Telephone Encounter (Signed)
Jeremy Peck's wife called and states he is needed a refill on Xanax. He has had the Xanax in the past. His sister died unexpectedly this morning. Please advise.

## 2018-12-03 ENCOUNTER — Encounter: Payer: Self-pay | Admitting: Physician Assistant

## 2019-01-17 ENCOUNTER — Other Ambulatory Visit: Payer: Self-pay | Admitting: Physician Assistant

## 2019-01-17 DIAGNOSIS — E782 Mixed hyperlipidemia: Secondary | ICD-10-CM

## 2019-01-28 ENCOUNTER — Other Ambulatory Visit: Payer: Self-pay | Admitting: Physician Assistant

## 2019-01-28 DIAGNOSIS — Z130 Encounter for screening for diseases of the blood and blood-forming organs and certain disorders involving the immune mechanism: Secondary | ICD-10-CM

## 2019-01-28 DIAGNOSIS — Z5181 Encounter for therapeutic drug level monitoring: Secondary | ICD-10-CM

## 2019-01-28 DIAGNOSIS — E782 Mixed hyperlipidemia: Secondary | ICD-10-CM

## 2019-01-28 DIAGNOSIS — Z131 Encounter for screening for diabetes mellitus: Secondary | ICD-10-CM

## 2019-01-28 DIAGNOSIS — F909 Attention-deficit hyperactivity disorder, unspecified type: Secondary | ICD-10-CM

## 2019-01-29 MED ORDER — METHYLPHENIDATE HCL 10 MG PO TABS: 10.0000 mg | ORAL_TABLET | Freq: Two times a day (BID) | ORAL | 0 refills | Status: DC

## 2019-01-29 MED ORDER — ATORVASTATIN CALCIUM 20 MG PO TABS: 20.0000 mg | ORAL_TABLET | Freq: Every day | ORAL | 0 refills | Status: DC

## 2019-01-29 NOTE — Telephone Encounter (Signed)
Pt notified. KG LPN 

## 2019-03-13 ENCOUNTER — Telehealth: Payer: 59 | Admitting: Physician Assistant

## 2019-03-13 DIAGNOSIS — Z20828 Contact with and (suspected) exposure to other viral communicable diseases: Secondary | ICD-10-CM

## 2019-03-13 DIAGNOSIS — Z20822 Contact with and (suspected) exposure to covid-19: Secondary | ICD-10-CM

## 2019-03-13 NOTE — Progress Notes (Signed)
   My chart visit converted to phone visit at pt request: 12:37 PM Left voicemail for patient to return my call.    12:39 PM Pt returned phone call.  Reports fever x2 days.  He reports feeling somewhat short of breath last night when trying to go to bed but also had significant nasal congestion and fever at the time.  He reports no shortness of breath this morning or right now. Pt reports taking Aleve with resolution of fever last night and again today.  Pt reports some very mild SOB with significant exertion, but no SOB at rest or walking around the house.  Denies any difficulty breathing at this time. Pt denies hx of asthma or COPD.  Pt denies current or previous hx of smoking.  Pt denies hx of immunocompromise. Pt has ordered pulse ox which will arrive tomorrow.  He has been tested for COVID but is awaiting results.  His significant other has the same symptoms.  Pt is able to speak in complete sentences without difficulty during phone interview.  Recommend tylenol and ibuprofen for fever control.      E-Visit for [employer] Virus Screening  Based on what you have shared with me you likely have coronavirus causing your symptoms or some other illness.   If you have worsening symptoms of any kind, particularly return of shortness of breath, difficulty breathing, persistently high fevers, persistent vomiting, abdominal pain or pulse ox <90%, seek medical care at an emergency room immediatly. Our Emergency Departments are best equipped to handle patients with severe symptoms.  You will be evaluated by the ER provider (or higher level of care provider) who will determine whether you need formal testing.  If you are having a true medical emergency please call 911.   I recommend the following:  . [facility] Emergency Department [place], [place], [place] [address] [phone]  . [facility] Emergency Department [place], [place], [place] [address] [phone]  . [facility] Emergency Department [place], [place], [place] [address] [phone]  . [facility] Emergency Department [address] [place], [place], [place] [address] [phone]  . [facility] Emergency Department [place], [place], [place] [address] [phone]   Your e-visit answers were reviewed by a board certified advanced clinical practitioner to complete your personal care plan.  Thank you for using e-Visits.   Greater 10 minutes but less than 25 minutes of time have been spent researching, coordinating, and implementing care for this patient today

## 2019-03-17 ENCOUNTER — Encounter (HOSPITAL_COMMUNITY): Payer: Self-pay

## 2019-03-17 ENCOUNTER — Ambulatory Visit (HOSPITAL_COMMUNITY)
Admission: EM | Admit: 2019-03-17 | Discharge: 2019-03-17 | Disposition: A | Discharge status: Home / Self Care | Payer: 59 | Attending: Emergency Medicine | Admitting: Emergency Medicine

## 2019-03-17 ENCOUNTER — Telehealth: Payer: 59 | Admitting: Physician Assistant

## 2019-03-17 ENCOUNTER — Other Ambulatory Visit: Payer: Self-pay

## 2019-03-17 DIAGNOSIS — R52 Pain, unspecified: Secondary | ICD-10-CM

## 2019-03-17 DIAGNOSIS — Z20828 Contact with and (suspected) exposure to other viral communicable diseases: Secondary | ICD-10-CM | POA: Insufficient documentation

## 2019-03-17 DIAGNOSIS — R509 Fever, unspecified: Secondary | ICD-10-CM | POA: Diagnosis not present

## 2019-03-17 DIAGNOSIS — R0600 Dyspnea, unspecified: Secondary | ICD-10-CM

## 2019-03-17 LAB — CBC
HCT: 43.7 % (ref 39.0–52.0)
Hemoglobin: 15.7 g/dL (ref 13.0–17.0)
MCH: 29.1 pg (ref 26.0–34.0)
MCHC: 35.9 g/dL (ref 30.0–36.0)
MCV: 80.9 fL (ref 80.0–100.0)
Platelets: 109 10*3/uL — ABNORMAL LOW (ref 150–400)
RBC: 5.4 MIL/uL (ref 4.22–5.81)
RDW: 12.2 % (ref 11.5–15.5)
WBC: 5.3 10*3/uL (ref 4.0–10.5)
nRBC: 0 % (ref 0.0–0.2)

## 2019-03-17 LAB — POCT RAPID STREP A: Streptococcus, Group A Screen (Direct): NEGATIVE

## 2019-03-17 NOTE — ED Provider Notes (Signed)
[facility]    CSN: [id number] Arrival date & time: 03/17/19  1321      History   Chief Complaint Chief Complaint  Patient presents with  . Fever    HPI Jeremy Peck is a 38 y.o. male.   Patient presents with fever, shortness of breath, and fatigue x 5 days. Tmax 102.8 on 03/15/2019.  He is treating this at home with Tylenol alternating with ibuprofen.  He also reports intermittent sore throat over the past 2 days, worse in the mornings.  He denies difficulty swallowing, cough, abdominal pain, vomiting, diarrhea, rash, or other symptoms.  Patient states he had a negative COVID test on 03/12/2019.  The history is provided by the patient.    Past Medical History:  Diagnosis Date  . Anxiety   . Hyperlipidemia   . Pericarditis   . Syncope and collapse 2001   ? seizure    Patient Active Problem List   Diagnosis Date Noted  . Encounter for long-term (current) use of medications 03/26/2018  . White coat syndrome without diagnosis of hypertension 03/26/2018  . Metatarsalgia, left foot 03/26/2018  . Encounter for monitoring statin therapy 11/06/2017  . Mixed hyperlipidemia 08/09/2017  . Adult ADHD 07/01/2017  . Flying phobia 07/01/2017  . Social anxiety disorder 07/01/2017    Past Surgical History:  Procedure Laterality Date  . TONSILLECTOMY         Home Medications    Prior to Admission medications   Medication Sig Start Date End Date Taking? Authorizing Provider  ALPRAZolam Duanne Moron) 1 MG tablet Take 1 tablet (1 mg total) by mouth 2 (two) times daily as needed for anxiety. 11/21/18   Emeterio Reeve, DO  atorvastatin (LIPITOR) 20 MG tablet Take 1 tablet (20 mg total) by mouth daily at 6 PM. DUE FOR LABS 01/29/19   Trixie Dredge, PA-C  Cholecalciferol ([employer] VIT D 5000 HIGH-POTENCY) 5000 units capsule Take 5,000 Units by mouth daily.    [provider]  CREATINE PO Take by mouth.    [provider]  methylphenidate (RITALIN)  10 MG tablet Take 1 tablet (10 mg total) by mouth 2 (two) times daily with breakfast and lunch. 01/29/19   Trixie Dredge, PA-C    Family History Family History  Problem Relation Age of Onset  . Throat cancer Father   . Heart block Brother   . Alcohol abuse Brother     Social History Social History   Tobacco Use  . Smoking status: Never Smoker  . Smokeless tobacco: Never Used  Substance Use Topics  . Alcohol use: Not Currently    Alcohol/week: 2.0 standard drinks    Types: 2 Standard drinks or equivalent per week  . Drug use: No     Allergies   Patient has no known allergies.   Review of Systems Review of Systems  Constitutional: Positive for fatigue and fever. Negative for chills.  HENT: Negative for congestion, ear pain, rhinorrhea and sore throat.   Eyes: Negative for pain and visual disturbance.  Respiratory: Positive for shortness of breath. Negative for cough.   Cardiovascular: Negative for chest pain and palpitations.  Gastrointestinal: Negative for abdominal pain, diarrhea, nausea and vomiting.  Genitourinary: Negative for dysuria and hematuria.  Musculoskeletal: Negative for arthralgias and back pain.  Skin: Negative for color change and rash.  Neurological: Negative for seizures and syncope.  All other systems reviewed and are negative.    Physical Exam Triage Vital Signs ED Triage Vitals  Enc Vitals Group     BP 03/17/19 1342 125/87     Pulse Rate 03/17/19 1342 87     Resp 03/17/19 1342 16     Temp 03/17/19 1342 (!) 97.2 F (36.2 C)     Temp Source 03/17/19 1342 Tympanic     SpO2 03/17/19 1342 100 %     Weight --      Height --      Head Circumference --      Peak Flow --      Pain Score 03/17/19 1345 1     Pain Loc --      Pain Edu? --      Excl. in GC? --    No data found.  Updated Vital Signs BP 125/87 (BP Location: Left Arm)   Pulse 87   Temp (!) 97.2 F (36.2 C) (Tympanic)   Resp 16   SpO2 100%   Visual Acuity  Right Eye Distance:   Left Eye Distance:   Bilateral Distance:    Right Eye Near:   Left Eye Near:    Bilateral Near:     Physical Exam Vitals signs and nursing note reviewed.  Constitutional:      General: He is not in acute distress.    Appearance: He is well-developed. He is not ill-appearing.     Comments: Well-appearing.  HENT:     Head: Normocephalic and atraumatic.     Right Ear: Tympanic membrane normal.     Left Ear: Tympanic membrane normal.     Nose: Nose normal.     Mouth/Throat:     Mouth: Mucous membranes are moist.     Pharynx: Oropharynx is clear.  Eyes:     Conjunctiva/sclera: Conjunctivae normal.  Neck:     Musculoskeletal: Neck supple.  Cardiovascular:     Rate and Rhythm: Normal rate and regular rhythm.     Heart sounds: No murmur.  Pulmonary:     Effort: Pulmonary effort is normal. No respiratory distress.     Breath sounds: Normal breath sounds.  Abdominal:     General: Bowel sounds are normal.     Palpations: Abdomen is soft.     Tenderness: There is no abdominal tenderness. There is no right CVA tenderness, left CVA tenderness, guarding or rebound.  Skin:    General: Skin is warm and dry.     Findings: No rash.  Neurological:     General: No focal deficit present.     Mental Status: He is alert and oriented to person, place, and time.      UC Treatments / Results  Labs (all labs ordered are listed, but only abnormal results are displayed) Labs Reviewed  CBC - Abnormal; Notable for the following components:      Result Value   Platelets 109 (*)    All other components within normal limits  NOVEL CORONAVIRUS, NAA (HOSP ORDER, SEND-OUT TO REF LAB; TAT 18-24 HRS)  CULTURE, GROUP A STREP [facility]  POCT RAPID STREP A    EKG   Radiology No results found.  Procedures Procedures (including critical care time)  Medications Ordered in UC Medications - No data to display  Initial Impression / Assessment and Plan / UC Course  I have  reviewed the triage vital signs and the nursing notes.  Pertinent labs & imaging results that were available during my care of the patient were reviewed by me and considered in my medical decision making (see chart for details).  Fever.  Rapid strep negative; throat culture pending.  WBC normal.  Patient is well-appearing and his exam is unremarkable.  COVID test performed here.  Patient to follow-up with his PCP tomorrow if he is still febrile.  Discussed with him that he should go to the emergency department if he develops acute worsening symptoms or new symptoms: including high fever, shortness of breath, vomiting, diarrhea.  Patient agrees to plan of care.     Final Clinical Impressions(s) / UC Diagnoses   Final diagnoses:  Fever, unspecified fever cause     Discharge Instructions     Your rapid strep test is negative.  A throat culture is pending; we will call you if it is positive requiring treatment.    Your COVID test is pending.  You should self quarantine until your test result is back and is negative.    Go to the emergency department if you develop high fever, shortness of breath, severe diarrhea, or other concerning symptoms.    Follow-up with your primary care provider tomorrow.         ED Prescriptions    None     PDMP not reviewed this encounter.   Mickie Bail, NP 03/17/19 [phone]

## 2019-03-17 NOTE — Discharge Instructions (Addendum)
Your rapid strep test is negative.  A throat culture is pending; we will call you if it is positive requiring treatment.    Your COVID test is pending.  You should self quarantine until your test result is back and is negative.    Go to the emergency department if you develop high fever, shortness of breath, severe diarrhea, or other concerning symptoms.    Follow-up with your primary care provider tomorrow.

## 2019-03-17 NOTE — ED Triage Notes (Signed)
Pt presents to UC stating he has felt sluggish x5 days w/ low grade fever at 100.5. Pt states highest fever got was 102.8. Pt reports taking tylenol and motrin to help with fever.

## 2019-03-17 NOTE — Progress Notes (Signed)
Based on what you shared with me, I feel your condition warrants further evaluation and I recommend that you be seen for a face to face office visit.  You were evaluated several days ago for symptoms that were concerning for the coronavirus. If your coronavirus testing was negative at that time and you are still have persistent symptoms, then you may have had a false negative test or you may have another illness such as pneumonia. Because of this, you should have a face to face evaluation so that your vital signs can be assessed and you may also need further testing completed such as a chest xray.   NOTE: If you entered your credit card information for this eVisit, you will not be charged. You may see a "hold" on your card for the $35 but that hold will drop off and you will not have a charge processed.  If you are having a true medical emergency please call 911.     For an urgent face to face visit, [place] has four urgent care centers for your convenience:   . [facility]    [phone]                  Get Driving Directions  [address], [place] [address] . 10 am to 8 pm Monday-Friday . 12 pm to 8 pm Saturday-Sunday   . [facility] at [place]                  Get Driving Directions  [address], [place], [place] [address] . 8 am to 8 pm Monday-Friday . 9 am to 6 pm Saturday . 11 am to 6 pm Sunday   . [facility] at [place]                  Get Driving Directions   [address] [place], [place] [address] . 8 am to 8 pm Monday-Friday . 8 am to 4 pm Saturday-Sunday    . [facility] at [place]                    Get Driving Directions  [phone]  [address], [place], [place] [address]  . Monday-Friday, 12 PM to 6 PM   You may also seek evaluation at any of the following emergency departments:  [facility] Emergency Department [place], [place], [place] [address] [facility] Emergency Department [place], [place], [place] [address] [facility] Emergency Department [place], [place], [place] [address] [facility] Emergency Department [place], [place], [place] [address] [facility] Emergency Department [place], [place], [place] [address] [phone]   Your e-visit answers were reviewed by a board certified advanced clinical practitioner to complete your personal care plan.  Thank you for using e-Visits.  Greater than 5 minutes, yet less than 10 minutes of time have been spend researching, coordinating, and implementing care for this patient today.

## 2019-03-18 LAB — NOVEL CORONAVIRUS, NAA (HOSP ORDER, SEND-OUT TO REF LAB; TAT 18-24 HRS): SARS-CoV-2, NAA: NOT DETECTED

## 2019-03-20 ENCOUNTER — Telehealth (HOSPITAL_COMMUNITY): Payer: Self-pay | Admitting: Emergency Medicine

## 2019-03-20 LAB — CULTURE, GROUP A STREP (THRC)

## 2019-03-20 NOTE — Telephone Encounter (Signed)
No significant abnormalities, labs were noted in provider chart. Patient contacted and made aware of    results, all questions answered

## 2019-03-22 ENCOUNTER — Other Ambulatory Visit: Payer: Self-pay | Admitting: Physician Assistant

## 2019-03-22 DIAGNOSIS — E782 Mixed hyperlipidemia: Secondary | ICD-10-CM

## 2019-05-22 ENCOUNTER — Other Ambulatory Visit: Payer: Self-pay | Admitting: Physician Assistant

## 2019-05-22 DIAGNOSIS — E782 Mixed hyperlipidemia: Secondary | ICD-10-CM

## 2019-05-22 DIAGNOSIS — F909 Attention-deficit hyperactivity disorder, unspecified type: Secondary | ICD-10-CM

## 2019-05-22 MED ORDER — METHYLPHENIDATE HCL 10 MG PO TABS: 10.0000 mg | ORAL_TABLET | Freq: Two times a day (BID) | ORAL | 0 refills | Status: DC

## 2019-05-22 MED ORDER — ATORVASTATIN CALCIUM 20 MG PO TABS: 20.0000 mg | ORAL_TABLET | Freq: Every day | ORAL | 0 refills | Status: DC

## 2019-05-30 ENCOUNTER — Other Ambulatory Visit: Payer: Self-pay | Admitting: Physician Assistant

## 2019-05-30 DIAGNOSIS — E782 Mixed hyperlipidemia: Secondary | ICD-10-CM

## 2019-06-14 ENCOUNTER — Other Ambulatory Visit: Payer: Self-pay | Admitting: Physician Assistant

## 2019-06-14 DIAGNOSIS — E782 Mixed hyperlipidemia: Secondary | ICD-10-CM

## 2019-07-22 ENCOUNTER — Other Ambulatory Visit: Payer: Self-pay | Admitting: Physician Assistant

## 2019-07-22 DIAGNOSIS — E782 Mixed hyperlipidemia: Secondary | ICD-10-CM

## 2020-02-09 ENCOUNTER — Ambulatory Visit (INDEPENDENT_AMBULATORY_CARE_PROVIDER_SITE_OTHER): Payer: 59 | Admitting: Medical-Surgical

## 2020-02-09 ENCOUNTER — Other Ambulatory Visit: Payer: Self-pay

## 2020-02-09 ENCOUNTER — Encounter: Payer: Self-pay | Admitting: Medical-Surgical

## 2020-02-09 VITALS — BP 125/87 | HR 63 | Temp 98.1°F | Ht 72.5 in | Wt 198.6 lb

## 2020-02-09 DIAGNOSIS — J309 Allergic rhinitis, unspecified: Secondary | ICD-10-CM

## 2020-02-09 DIAGNOSIS — Z23 Encounter for immunization: Secondary | ICD-10-CM

## 2020-02-09 DIAGNOSIS — F909 Attention-deficit hyperactivity disorder, unspecified type: Secondary | ICD-10-CM | POA: Diagnosis not present

## 2020-02-09 DIAGNOSIS — Z0000 Encounter for general adult medical examination without abnormal findings: Secondary | ICD-10-CM | POA: Diagnosis not present

## 2020-02-09 DIAGNOSIS — Z114 Encounter for screening for human immunodeficiency virus [HIV]: Secondary | ICD-10-CM

## 2020-02-09 DIAGNOSIS — F40243 Fear of flying: Secondary | ICD-10-CM | POA: Diagnosis not present

## 2020-02-09 DIAGNOSIS — E782 Mixed hyperlipidemia: Secondary | ICD-10-CM

## 2020-02-09 DIAGNOSIS — R0683 Snoring: Secondary | ICD-10-CM | POA: Diagnosis not present

## 2020-02-09 DIAGNOSIS — Z1159 Encounter for screening for other viral diseases: Secondary | ICD-10-CM | POA: Diagnosis not present

## 2020-02-09 MED ORDER — ATORVASTATIN CALCIUM 20 MG PO TABS: 20.0000 mg | ORAL_TABLET | Freq: Every day | ORAL | 3 refills | Status: DC

## 2020-02-09 MED ORDER — ALPRAZOLAM 1 MG PO TABS: 0.5000 mg | ORAL_TABLET | Freq: Two times a day (BID) | ORAL | 0 refills | Status: DC | PRN

## 2020-02-09 MED ORDER — NYSTATIN 100000 UNIT/GM EX POWD: 1.0000 "application " | Freq: Three times a day (TID) | CUTANEOUS | 0 refills | Status: DC

## 2020-02-09 MED ORDER — METHYLPHENIDATE HCL 10 MG PO TABS: 10.0000 mg | ORAL_TABLET | Freq: Two times a day (BID) | ORAL | 0 refills | Status: DC

## 2020-02-09 NOTE — Patient Instructions (Signed)

## 2020-02-09 NOTE — Progress Notes (Signed)
HPI: Jeremy Peck is a 39 y.o. male who  has a past medical history of Anxiety, Hyperlipidemia, Pericarditis, and Syncope and collapse (2001).  he presents to [facility] today, 02/09/20,  for chief complaint of:  Annual physical exam  Dentist- overdue, no concerns Eye- just had one, glasses and contacts, freckle on eye being monitored Exercise- lift weights 2-3 x weekly Diet- Vegetarian but does eat chicken and dairy; drinks water mainly  Allergies- has significant sinus congestion from environmental allergies. Has been using Mucinex nasal spray regularly with minimal relief. Sometimes so congested he can't breath through his nose at all. This causes a bit of anxiety, especially when wearing a mask. Not currently taking a daily antihistamine or using Flonase.  Possible sleep apnea- fiance has told him that he breathes funny at night. Notes that this, and loud snoring, is worse with increased sinus congestion. No previous evaluation for sleep apnea.  STOP-BANG for SLEEP APNEA Do you Snore loudly? Yes Do you often feel Tired during day? Yes Has anyone Observed you stop breathing? Yes History of high blood Pressure? No BMI >35? No Age >50? No Neck circumference >16 in? Yes Gender male? Yes 5-8 = high risk 3-4 = intermediate 0-2 = low risk   Armpit rash- present off and on for at least a year, skin sticky even when dry, flares up with sweating, uses powder (cornstarch, other nonmedicated powder) and creams (clotrimazole) intermittently with periods of improvement but no complete resolution. At times, a similar rash affects his groin and inner thigh area.  ADHD- struggles with focusing, especially when at work. Takes Ritalin BID usually on work days only. Feels as if he wouldn't be able to do his job without it. Overdue for refills.  Flying phobia- will be taking a few trips soon with his upcoming wedding. Has a terrible fear of flying and usually  uses Xanax to help manage his anxiety. Would like a refill on this so that his trip to [place] for his honeymoon is not so traumatic.   Hyperlipidemia- takes Lipitor 20mg  daily but has been out of this for several months. Checked his cholesterol when they were calibrating one of the machines at work with the total cholesterol level somewhere around 250. Has been eating healthy and exercising regularly for at least the past 3 months.  Past medical, surgical, social and family history reviewed:  Patient Active Problem List   Diagnosis Date Noted  . Encounter for long-term (current) use of medications 03/26/2018  . White coat syndrome without diagnosis of hypertension 03/26/2018  . Metatarsalgia, left foot 03/26/2018  . Encounter for monitoring statin therapy 11/06/2017  . Mixed hyperlipidemia 08/09/2017  . Adult ADHD 07/01/2017  . Flying phobia 07/01/2017  . Social anxiety disorder 07/01/2017    Past Surgical History:  Procedure Laterality Date  . TONSILLECTOMY      Social History   Tobacco Use  . Smoking status: Former 07/03/2017  . Smokeless tobacco: Never Used  Substance Use Topics  . Alcohol use: Yes    Comment: Rarely    Family History  Problem Relation Age of Onset  . Throat cancer Father   . Heart block Brother   . Alcohol abuse Brother      Current medication list and allergy/intolerance information reviewed:    Current Outpatient Medications  Medication Sig Dispense Refill  . Cholecalciferol ([employer] VIT D 5000 HIGH-POTENCY) 5000 units capsule Take 5,000 Units by mouth daily.    Games developer CREATINE  PO Take by mouth.    . ALPRAZolam (XANAX) 1 MG tablet Take 0.5-1 tablets (0.5-1 mg total) by mouth 2 (two) times daily as needed for anxiety. 20 tablet 0  . atorvastatin (LIPITOR) 20 MG tablet Take 1 tablet (20 mg total) by mouth daily at 6 PM. 90 tablet 3  . methylphenidate (RITALIN) 10 MG tablet Take 1 tablet (10 mg total) by mouth 2 (two) times daily with breakfast and lunch. 60  tablet 0  . nystatin (MYCOSTATIN/NYSTOP) powder Apply 1 application topically 3 (three) times daily. 15 g 0   No current facility-administered medications for this visit.    No Known Allergies    Review of Systems:  Constitutional:  No  fever, no chills, No recent illness, No unintentional weight changes. + significant fatigue.   HEENT: No  headache, no vision change, no hearing change, No sore throat, +  sinus pressure  Cardiac: No  chest pain, No  pressure, No palpitations, No  Orthopnea  Respiratory:  No  shortness of breath. No  Cough  Gastrointestinal: No  abdominal pain, No  nausea, No  vomiting,  No  blood in stool, No  diarrhea, No  constipation   Musculoskeletal: No new myalgia/arthralgia  Skin: + Rash, No other wounds/concerning lesions  Genitourinary: No  incontinence, No  abnormal genital bleeding, No abnormal genital discharge  Hem/Onc: No  easy bruising/bleeding, No  abnormal lymph node  Endocrine: No cold intolerance,  No heat intolerance. No polyuria/polydipsia/polyphagia   Neurologic: No  weakness, No  dizziness, No  slurred speech/focal weakness/facial droop  Psychiatric: No  concerns with depression, No  concerns with anxiety, No sleep problems, No mood problems  Exam:  BP 125/87   Pulse 63   Temp 98.1 F (36.7 C) (Oral)   Ht 6' 0.5" (1.842 m)   Wt 198 lb 9.6 oz (90.1 kg)   SpO2 99%   BMI 26.56 kg/m   Constitutional: VS see above. General Appearance: alert, well-developed, well-nourished, NAD  Eyes: Normal lids and conjunctive, non-icteric sclera  Ears, Nose, Mouth, Throat: MMM, Normal external inspection ears/nares/mouth/lips/gums. TM normal bilaterally. Pharynx/tonsils no erythema, no exudate. Nasal mucosa normal.   Neck: No masses, trachea midline. No thyroid enlargement. No tenderness/mass appreciated. No lymphadenopathy  Respiratory: Normal respiratory effort. no wheeze, no rhonchi, no rales  Cardiovascular: S1/S2 normal, no murmur, no  rub/gallop auscultated. RRR. No lower extremity edema. Pedal pulse II/IV bilaterally DP and PT. No carotid bruit or JVD. No abdominal aortic bruit.  Gastrointestinal: Nontender, no masses. No hepatomegaly, no splenomegaly. No hernia appreciated. Bowel sounds normal. Rectal exam deferred.   Musculoskeletal: Gait normal. No clubbing/cyanosis of digits.   Neurological: Normal balance/coordination. No tremor. No cranial nerve deficit on limited exam. Motor and sensation intact and symmetric. Cerebellar reflexes intact.   Skin: warm, dry, intact. Mild skin irritation to bilateral axilla but no obvious erythema or rash present today. No concerning nevi or subq nodules on limited exam.    Psychiatric: Normal judgment/insight. Normal mood and affect. Oriented x3.    No results found for this or any previous visit (from the past 72 hour(s)).  No results found.   ASSESSMENT/PLAN:   1. Need for influenza vaccination Flu vaccine given in office today. - Flu Vaccine QUAD 36+ mos IM  2. Need for hepatitis C screening test Discussed hepatitis C screening. Patient declined.  3. Screening for HIV (human immunodeficiency virus) Discussed HIV screening recommendations. Patient declined.  4. Allergic rhinitis, unspecified seasonality, unspecified trigger Recommend daily  OTC antihistamine. Also recommend using Flonase regularly during allergy season. Okay to use Mucinex nasal spray for severe congestion. Once doing all of this regularly, if not much improved, may benefit from adding Singulair daily.  5. Flying phobia Discussed use of Xanax as a rescue medication. Refill provided for sparing use only. Last refill given in 11/2018 and used appropriately. Patient verbalized understanding. - ALPRAZolam (XANAX) 1 MG tablet; Take 0.5-1 tablets (0.5-1 mg total) by mouth 2 (two) times daily as needed for anxiety.  Dispense: 20 tablet; Refill: 0  6. Mixed hyperlipidemia Checking lipid panel. Restart  Atorvastatin 20mg  daily. Refills provided. - atorvastatin (LIPITOR) 20 MG tablet; Take 1 tablet (20 mg total) by mouth daily at 6 PM.  Dispense: 90 tablet; Refill: 3  7. Adult ADHD Continue Ritalin 10mg  BID. Refills provided.  - methylphenidate (RITALIN) 10 MG tablet; Take 1 tablet (10 mg total) by mouth 2 (two) times daily with breakfast and lunch.  Dispense: 60 tablet; Refill: 0  8. Annual physical exam Checking CBC, CMP, and Lipid panel. - Lipid panel - CBC - COMPLETE METABOLIC PANEL WITH GFR  9. Loud snoring Discussed sleep apnea and it's effects on the body. STOPBANG score of 5 indicating high risk. Order sleep study, preferably for home testing. Advised patient that his insurance may require the use of a [facility] facility for testing but we will reach out to him to let him know if this is the case.  - Home sleep test  Orders Placed This Encounter  Procedures  . Flu Vaccine QUAD 36+ mos IM  . Lipid panel  . CBC  . COMPLETE METABOLIC PANEL WITH GFR  . Home sleep test    Meds ordered this encounter  Medications  . ALPRAZolam (XANAX) 1 MG tablet    Sig: Take 0.5-1 tablets (0.5-1 mg total) by mouth 2 (two) times daily as needed for anxiety.    Dispense:  20 tablet    Refill:  0    Order Specific Question:   Supervising Provider    Answer:     . atorvastatin (LIPITOR) 20 MG tablet    Sig: Take 1 tablet (20 mg total) by mouth daily at 6 PM.    Dispense:  90 tablet    Refill:  3    Order Specific Question:   Supervising Provider    Answer:   Sunnie Nielsen [id number]  . methylphenidate (RITALIN) 10 MG tablet    Sig: Take 1 tablet (10 mg total) by mouth 2 (two) times daily with breakfast and lunch.    Dispense:  60 tablet    Refill:  0    Order Specific Question:   Supervising Provider    Answer:   Sunnie Nielsen [[id number]]  . nystatin (MYCOSTATIN/NYSTOP) powder    Sig: Apply 1 application topically 3 (three) times daily.    Dispense:  15 g     Refill:  0    Order Specific Question:   Supervising Provider    Answer:   Sunnie Nielsen [id number]    Patient Instructions  Influenza (Flu) Vaccine (Inactivated or Recombinant): What You Need to Know 1. Why get vaccinated? Influenza vaccine can prevent influenza (flu). Flu is a contagious disease that spreads around the [place] every year, usually between October and May. Anyone can get the flu, but it is more dangerous for some people. Infants and young children, people 75 years of age and older, pregnant women, and people with certain health conditions or a weakened  immune system are at greatest risk of flu complications. Pneumonia, bronchitis, sinus infections and ear infections are examples of flu-related complications. If you have a medical condition, such as heart disease, cancer or diabetes, flu can make it worse. Flu can cause fever and chills, sore throat, muscle aches, fatigue, cough, headache, and runny or stuffy nose. Some people may have vomiting and diarrhea, though this is more common in children than adults. Each year thousands of people in the [place] die from flu, and many more are hospitalized. Flu vaccine prevents millions of illnesses and flu-related visits to the doctor each year. 2. Influenza vaccine CDC recommends everyone 70 months of age and older get vaccinated every flu season. Children 6 months through 80 years of age may need 2 doses during a single flu season. Everyone else needs only 1 dose each flu season. It takes about 2 weeks for protection to develop after vaccination. There are many flu viruses, and they are always changing. Each year a new flu vaccine is made to protect against three or four viruses that are likely to cause disease in the upcoming flu season. Even when the vaccine doesn't exactly match these viruses, it may still provide some protection. Influenza vaccine does not cause flu. Influenza vaccine may be given at the same time as  other vaccines. 3. Talk with your health care provider Tell your vaccine provider if the person getting the vaccine:  Has had an allergic reaction after a previous dose of influenza vaccine, or has any severe, life-threatening allergies.  Has ever had Guillain-Barr Syndrome (also called GBS). In some cases, your health care provider may decide to postpone influenza vaccination to a future visit. People with minor illnesses, such as a cold, may be vaccinated. People who are moderately or severely ill should usually wait until they recover before getting influenza vaccine. Your health care provider can give you more information. 4. Risks of a vaccine reaction  Soreness, redness, and swelling where shot is given, fever, muscle aches, and headache can happen after influenza vaccine.  There may be a very small increased risk of Guillain-Barr Syndrome (GBS) after inactivated influenza vaccine (the flu shot). Young children who get the flu shot along with pneumococcal vaccine (PCV13), and/or DTaP vaccine at the same time might be slightly more likely to have a seizure caused by fever. Tell your health care provider if a child who is getting flu vaccine has ever had a seizure. People sometimes faint after medical procedures, including vaccination. Tell your provider if you feel dizzy or have vision changes or ringing in the ears. As with any medicine, there is a very remote chance of a vaccine causing a severe allergic reaction, other serious injury, or death. 5. What if there is a serious problem? An allergic reaction could occur after the vaccinated person leaves the clinic. If you see signs of a severe allergic reaction (hives, swelling of the face and throat, difficulty breathing, a fast heartbeat, dizziness, or weakness), call 9-1-1 and get the person to the nearest hospital. For other signs that concern you, call your health care provider. Adverse reactions should be reported to the Vaccine  Adverse Event Reporting System (VAERS). Your health care provider will usually file this report, or you can do it yourself. Visit the VAERS website at www.vaers.LAgents.no or call 727-003-9653.VAERS is only for reporting reactions, and VAERS staff do not give medical advice. 6. The National Vaccine Injury Compensation Program The [employer] Vaccine Injury Compensation Program (VICP) is a  federal program that was created to compensate people who may have been injured by certain vaccines. Visit the VICP website at SpiritualWord.at or call 267-395-8471 to learn about the program and about filing a claim. There is a time limit to file a claim for compensation. 7. How can I learn more?  Ask your healthcare provider.  Call your local or state health department.  Contact the Centers for Disease Control and Prevention (CDC): ? Call 330-163-0527 (1-800-CDC-INFO) or ? Visit CDC's BiotechRoom.com.cy Vaccine Information Statement (Interim) Inactivated Influenza Vaccine (01/23/2018) This information is not intended to replace advice given to you by your health care provider. Make sure you discuss any questions you have with your health care provider. Document Revised: 09/16/2018 Document Reviewed: 01/27/2018 Elsevier Patient Education  2020 [employer].   Follow-up plan: Return in about 3 months (around 05/10/2020) for ADHD follow up.  Thayer Ohm, DNP, APRN, FNP-BC [facility]

## 2020-03-23 DIAGNOSIS — Z0000 Encounter for general adult medical examination without abnormal findings: Secondary | ICD-10-CM | POA: Diagnosis not present

## 2020-03-24 LAB — COMPLETE METABOLIC PANEL WITH GFR
AG Ratio: 1.8 (calc) (ref 1.0–2.5)
ALT: 15 U/L (ref 9–46)
AST: 20 U/L (ref 10–40)
Albumin: 4.6 g/dL (ref 3.6–5.1)
Alkaline phosphatase (APISO): 60 U/L (ref 36–130)
BUN: 15 mg/dL (ref 7–25)
CO2: 26 mmol/L (ref 20–32)
Calcium: 9.6 mg/dL (ref 8.6–10.3)
Chloride: 104 mmol/L (ref 98–110)
Creat: 1.31 mg/dL (ref 0.60–1.35)
GFR, Est African American: 79 mL/min/{1.73_m2} (ref >=60)
GFR, Est Non African American: 69 mL/min/{1.73_m2} (ref >=60)
Globulin: 2.6 g/dL (calc) (ref 1.9–3.7)
Glucose, Bld: 87 mg/dL (ref 65–99)
Potassium: 4.5 mmol/L (ref 3.5–5.3)
Sodium: 140 mmol/L (ref 135–146)
Total Bilirubin: 0.7 mg/dL (ref 0.2–1.2)
Total Protein: 7.2 g/dL (ref 6.1–8.1)

## 2020-03-24 LAB — CBC
HCT: 44.8 % (ref 38.5–50.0)
Hemoglobin: 15.1 g/dL (ref 13.2–17.1)
MCH: 29.1 pg (ref 27.0–33.0)
MCHC: 33.7 g/dL (ref 32.0–36.0)
MCV: 86.3 fL (ref 80.0–100.0)
MPV: 10.3 fL (ref 7.5–12.5)
Platelets: 216 10*3/uL (ref 140–400)
RBC: 5.19 10*6/uL (ref 4.20–5.80)
RDW: 13.2 % (ref 11.0–15.0)
WBC: 6.8 10*3/uL (ref 3.8–10.8)

## 2020-03-24 LAB — LIPID PANEL
Cholesterol: 125 mg/dL (ref <200)
HDL: 36 mg/dL — ABNORMAL LOW (ref >=40)
LDL Cholesterol (Calc): 72 mg/dL (calc)
Non-HDL Cholesterol (Calc): 89 mg/dL (calc) (ref <130)
Total CHOL/HDL Ratio: 3.5 (calc) (ref <5.0)
Triglycerides: 91 mg/dL (ref <150)

## 2020-04-12 ENCOUNTER — Other Ambulatory Visit: Payer: Self-pay | Admitting: Medical-Surgical

## 2020-04-12 DIAGNOSIS — F909 Attention-deficit hyperactivity disorder, unspecified type: Secondary | ICD-10-CM

## 2020-04-13 MED ORDER — METHYLPHENIDATE HCL 10 MG PO TABS: 10.0000 mg | ORAL_TABLET | Freq: Two times a day (BID) | ORAL | 0 refills | Status: DC

## 2020-04-21 ENCOUNTER — Encounter: Payer: Self-pay | Admitting: Medical-Surgical

## 2020-05-10 ENCOUNTER — Ambulatory Visit (INDEPENDENT_AMBULATORY_CARE_PROVIDER_SITE_OTHER): Payer: 59 | Admitting: Medical-Surgical

## 2020-05-10 ENCOUNTER — Encounter: Payer: Self-pay | Admitting: Medical-Surgical

## 2020-05-10 ENCOUNTER — Other Ambulatory Visit: Payer: Self-pay

## 2020-05-10 VITALS — BP 103/68 | HR 65 | Temp 98.4°F | Ht 72.5 in | Wt 206.3 lb

## 2020-05-10 DIAGNOSIS — F909 Attention-deficit hyperactivity disorder, unspecified type: Secondary | ICD-10-CM | POA: Diagnosis not present

## 2020-05-10 MED ORDER — METHYLPHENIDATE HCL 10 MG PO TABS: 10.0000 mg | ORAL_TABLET | Freq: Two times a day (BID) | ORAL | 0 refills | Status: DC

## 2020-05-10 NOTE — Progress Notes (Signed)
Subjective:    CC: ADHD follow up  HPI: Pleasant 39 year old male presenting today for ADHD follow-up.  He has been taking Ritalin 10 mg twice daily, tolerating well without side effects.  Sleeping well overall.  No changes in appetite or weight.  Notes that he there are some days he does not take either of his doses of Ritalin.  This is most often after working a long weekend when he has no plans for the day except to lay around and relax.  Notes these days are most often not very productive.  There are also some days when he does not take his second dose in the afternoon.  Most often takes both doses on workdays (Friday, Saturday, Sunday).  Feels the medication works well for him.  Denies fever, chills, chest pain, shortness of breath, and palpitations.  I reviewed the past medical history, family history, social history, surgical history, and allergies today and no changes were needed.  Please see the problem list section below in epic for further details.  Past Medical History: Past Medical History:  Diagnosis Date  . Anxiety   . Hyperlipidemia   . Pericarditis   . Syncope and collapse 2001   ? seizure   Past Surgical History: Past Surgical History:  Procedure Laterality Date  . TONSILLECTOMY     Social History: Social History   Socioeconomic History  . Marital status: Single    Spouse name: Not on file  . Number of children: Not on file  . Years of education: Not on file  . Highest education level: Not on file  Occupational History  . Not on file  Tobacco Use  . Smoking status: Former Games developer  . Smokeless tobacco: Never Used  Vaping Use  . Vaping Use: Never used  Substance and Sexual Activity  . Alcohol use: Yes    Comment: Rarely  . Drug use: No  . Sexual activity: Yes    Birth control/protection: None  Other Topics Concern  . Not on file  Social History Narrative  . Not on file   Social Determinants of Health   Financial Resource Strain:   . Difficulty of  Paying Living Expenses: Not on file  Food Insecurity:   . Worried About Programme researcher, broadcasting/film/video in the Last Year: Not on file  . Ran Out of Food in the Last Year: Not on file  Transportation Needs:   . Lack of Transportation (Medical): Not on file  . Lack of Transportation (Non-Medical): Not on file  Physical Activity:   . Days of Exercise per Week: Not on file  . Minutes of Exercise per Session: Not on file  Stress:   . Feeling of Stress : Not on file  Social Connections:   . Frequency of Communication with Friends and Family: Not on file  . Frequency of Social Gatherings with Friends and Family: Not on file  . Attends Religious Services: Not on file  . Active Member of Clubs or Organizations: Not on file  . Attends Banker Meetings: Not on file  . Marital Status: Not on file   Family History: Family History  Problem Relation Age of Onset  . Throat cancer Father   . Heart block Brother   . Alcohol abuse Brother    Allergies: No Known Allergies Medications: See med rec.  Review of Systems: See HPI for pertinent positives and negatives.   Objective:    General: Well Developed, well nourished, and in no acute distress.  Neuro: Alert and oriented x3.  HEENT: Normocephalic, atraumatic.  Skin: Warm and dry. Cardiac: Regular rate and rhythm, .  Respiratory: Not using accessory muscles, speaking in full sentences.   Impression and Recommendations:    1. Adult ADHD Continue Ritalin 10 mg twice daily as needed. - methylphenidate (RITALIN) 10 MG tablet; Take 1 tablet (10 mg total) by mouth 2 (two) times daily with breakfast and lunch.  Dispense: 60 tablet; Refill: 0 - methylphenidate (RITALIN) 10 MG tablet; Take 1 tablet (10 mg total) by mouth 2 (two) times daily.  Dispense: 60 tablet; Refill: 0 - methylphenidate (RITALIN) 10 MG tablet; Take 1 tablet (10 mg total) by mouth 2 (two) times daily.  Dispense: 60 tablet; Refill: 0  Return in about 3 months (around  08/08/2020) for ADHD follow up. ___________________________________________ Thayer Ohm, DNP, APRN, FNP-BC Primary Care and Sports Medicine [facility] [place]

## 2020-07-19 ENCOUNTER — Other Ambulatory Visit: Payer: Self-pay | Admitting: Medical-Surgical

## 2020-07-19 DIAGNOSIS — F909 Attention-deficit hyperactivity disorder, unspecified type: Secondary | ICD-10-CM

## 2020-07-21 NOTE — Telephone Encounter (Signed)
He has one on file for 07/12/2020

## 2020-08-08 ENCOUNTER — Encounter: Payer: Self-pay | Admitting: Medical-Surgical

## 2020-08-08 ENCOUNTER — Ambulatory Visit (INDEPENDENT_AMBULATORY_CARE_PROVIDER_SITE_OTHER): Payer: 59 | Admitting: Medical-Surgical

## 2020-08-08 ENCOUNTER — Other Ambulatory Visit: Payer: Self-pay

## 2020-08-08 VITALS — BP 115/74 | HR 74 | Temp 98.5°F | Ht 75.05 in | Wt 208.3 lb

## 2020-08-08 DIAGNOSIS — F909 Attention-deficit hyperactivity disorder, unspecified type: Secondary | ICD-10-CM

## 2020-08-08 MED ORDER — METHYLPHENIDATE HCL 10 MG PO TABS: 10.0000 mg | ORAL_TABLET | Freq: Three times a day (TID) | ORAL | 0 refills | Status: DC | PRN

## 2020-08-08 NOTE — Progress Notes (Signed)
Subjective:    CC: ADHD follow up  HPI: Pleasant 40 year old male presenting today for follow-up.  He has been treated with Ritalin 10 mg twice daily several years, tolerating well without side effects.  Notes that on days he works, he does take 2 doses per day.  On days he does not work he takes 1 but at times he will take none.  Feels the medication usually works for him but over the past few weeks has not been as effective.  He is unsure if this is related to the medication or if this is related to increased work stress.  He does work on the weekends and notes that they have been having problems staffing.  Denies fever, chills, weight changes, appetite changes, difficulty sleeping, chest pain, palpitations, dizziness, or headaches.  I reviewed the past medical history, family history, social history, surgical history, and allergies today and no changes were needed.  Please see the problem list section below in epic for further details.  Past Medical History: Past Medical History:  Diagnosis Date  . Anxiety   . Hyperlipidemia   . Pericarditis   . Syncope and collapse 2001   ? seizure   Past Surgical History: Past Surgical History:  Procedure Laterality Date  . TONSILLECTOMY     Social History: Social History   Socioeconomic History  . Marital status: Single    Spouse name: Not on file  . Number of children: Not on file  . Years of education: Not on file  . Highest education level: Not on file  Occupational History  . Not on file  Tobacco Use  . Smoking status: Former Games developer  . Smokeless tobacco: Never Used  Vaping Use  . Vaping Use: Never used  Substance and Sexual Activity  . Alcohol use: Yes    Comment: Rarely  . Drug use: No  . Sexual activity: Yes    Birth control/protection: None  Other Topics Concern  . Not on file  Social History Narrative  . Not on file   Social Determinants of Health   Financial Resource Strain: Not on file  Food Insecurity: Not on  file  Transportation Needs: Not on file  Physical Activity: Not on file  Stress: Not on file  Social Connections: Not on file   Family History: Family History  Problem Relation Age of Onset  . Throat cancer Father   . Heart block Brother   . Alcohol abuse Brother    Allergies: No Known Allergies Medications: See med rec.  Review of Systems: See HPI for pertinent positives and negatives.   Objective:    General: Well Developed, well nourished, and in no acute distress.  Neuro: Alert and oriented x3.  HEENT: Normocephalic, atraumatic.  Skin: Warm and dry. Cardiac: Regular rate and rhythm, no murmurs rubs or gallops, no lower extremity edema.  Respiratory: Clear to auscultation bilaterally. Not using accessory muscles, speaking in full sentences.  Impression and Recommendations:    1. Adult ADHD Discussed treatment options for better efficiency of medication.  Trial of Ritalin 10 mg 3 times daily as needed.  If this is not tolerated due to difficulty sleeping or other issues, reduce dose back to twice daily.  In that case, may benefit from a slightly higher dose of Ritalin.   - methylphenidate (RITALIN) 10 MG tablet; Take 1 tablet (10 mg total) by mouth 3 (three) times daily as needed.  Dispense: 90 tablet; Refill: 0  Return in about 3 months (around 11/05/2020) for  ADHD follow up. ___________________________________________ Thayer Ohm, DNP, APRN, FNP-BC Primary Care and Sports Medicine [facility]

## 2020-08-29 ENCOUNTER — Encounter: Payer: Self-pay | Admitting: Medical-Surgical

## 2020-09-03 ENCOUNTER — Other Ambulatory Visit: Payer: Self-pay | Admitting: Medical-Surgical

## 2020-09-03 DIAGNOSIS — F909 Attention-deficit hyperactivity disorder, unspecified type: Secondary | ICD-10-CM

## 2020-09-05 ENCOUNTER — Other Ambulatory Visit: Payer: Self-pay | Admitting: Medical-Surgical

## 2020-09-05 MED ORDER — METHYLPHENIDATE HCL 10 MG PO TABS: 10.0000 mg | ORAL_TABLET | Freq: Three times a day (TID) | ORAL | 0 refills | Status: DC | PRN

## 2020-10-17 ENCOUNTER — Other Ambulatory Visit (HOSPITAL_BASED_OUTPATIENT_CLINIC_OR_DEPARTMENT_OTHER): Payer: Self-pay

## 2020-10-17 ENCOUNTER — Other Ambulatory Visit: Payer: Self-pay | Admitting: Medical-Surgical

## 2020-10-17 DIAGNOSIS — F909 Attention-deficit hyperactivity disorder, unspecified type: Secondary | ICD-10-CM

## 2020-10-17 MED ORDER — METHYLPHENIDATE HCL 10 MG PO TABS
10.0000 mg | ORAL_TABLET | Freq: Three times a day (TID) | ORAL | 0 refills | Status: DC | PRN
  Filled 2020-10-17: qty 90, 30d supply, fill #0

## 2020-10-21 ENCOUNTER — Other Ambulatory Visit (HOSPITAL_BASED_OUTPATIENT_CLINIC_OR_DEPARTMENT_OTHER): Payer: Self-pay

## 2020-10-21 ENCOUNTER — Other Ambulatory Visit: Payer: Self-pay | Admitting: Medical-Surgical

## 2020-10-21 DIAGNOSIS — E782 Mixed hyperlipidemia: Secondary | ICD-10-CM

## 2020-10-21 MED ORDER — ATORVASTATIN CALCIUM 20 MG PO TABS
20.0000 mg | ORAL_TABLET | Freq: Every day | ORAL | 0 refills | Status: DC
  Filled 2020-10-21 – 2020-11-18 (×2): qty 30, 30d supply, fill #0

## 2020-10-28 ENCOUNTER — Other Ambulatory Visit (HOSPITAL_BASED_OUTPATIENT_CLINIC_OR_DEPARTMENT_OTHER): Payer: Self-pay

## 2020-11-08 ENCOUNTER — Other Ambulatory Visit: Payer: Self-pay

## 2020-11-08 ENCOUNTER — Other Ambulatory Visit (HOSPITAL_BASED_OUTPATIENT_CLINIC_OR_DEPARTMENT_OTHER): Payer: Self-pay

## 2020-11-08 ENCOUNTER — Ambulatory Visit (INDEPENDENT_AMBULATORY_CARE_PROVIDER_SITE_OTHER): Payer: 59 | Admitting: Medical-Surgical

## 2020-11-08 ENCOUNTER — Encounter: Payer: Self-pay | Admitting: Medical-Surgical

## 2020-11-08 VITALS — BP 118/80 | HR 61 | Temp 98.4°F | Resp 20 | Ht 75.05 in | Wt 205.0 lb

## 2020-11-08 DIAGNOSIS — F909 Attention-deficit hyperactivity disorder, unspecified type: Secondary | ICD-10-CM

## 2020-11-08 MED ORDER — METHYLPHENIDATE HCL 10 MG PO TABS: 10.0000 mg | ORAL_TABLET | Freq: Three times a day (TID) | ORAL | 0 refills | Status: DC | PRN

## 2020-11-08 MED ORDER — METHYLPHENIDATE HCL 10 MG PO TABS
10.0000 mg | ORAL_TABLET | Freq: Three times a day (TID) | ORAL | 0 refills | Status: DC | PRN
  Filled 2020-12-17: qty 90, 30d supply, fill #0

## 2020-11-08 MED ORDER — METHYLPHENIDATE HCL 10 MG PO TABS
10.0000 mg | ORAL_TABLET | Freq: Three times a day (TID) | ORAL | 0 refills | Status: DC | PRN
  Filled 2020-11-08: qty 90, 30d supply, fill #0

## 2020-11-08 NOTE — Progress Notes (Signed)
Subjective:    CC: ADHD follow up  HPI: Pleasant 40 year old male presenting today for ADHD follow up. Has been taking Ritalin 10mg  TID, tolerating well without side effects. Feels that the medication is still working well. Takes his doses at 6am, at lunch, and around 4pm. Sleeping well at night. No changes in appetite. Has been intentionally trying to lose weight doing dietary modification and exercise. Denies fever, chills, shortness of breath, chest pain, palpitations, and mood changes. Has had some tenderness to his submandibular left lymph node but no other symptoms. Wonders if this may be a normal immune response to something that he may have been exposed to. Has a 2 week trip planned in August to go to [place]. Plans to rent a [place] to tour [place].   I reviewed the past medical history, family history, social history, surgical history, and allergies today and no changes were needed.  Please see the problem list section below in epic for further details.  Past Medical History: Past Medical History:  Diagnosis Date  . Anxiety   . Hyperlipidemia   . Pericarditis   . Syncope and collapse 2001   ? seizure   Past Surgical History: Past Surgical History:  Procedure Laterality Date  . TONSILLECTOMY     Social History: Social History   Socioeconomic History  . Marital status: Single    Spouse name: Not on file  . Number of children: Not on file  . Years of education: Not on file  . Highest education level: Not on file  Occupational History  . Not on file  Tobacco Use  . Smoking status: Former 2002  . Smokeless tobacco: Never Used  Vaping Use  . Vaping Use: Never used  Substance and Sexual Activity  . Alcohol use: Yes    Comment: Rarely  . Drug use: No  . Sexual activity: Yes    Birth control/protection: None  Other Topics Concern  . Not on file  Social History Narrative  . Not on file   Social Determinants of Health   Financial Resource Strain: Not on file   Food Insecurity: Not on file  Transportation Needs: Not on file  Physical Activity: Not on file  Stress: Not on file  Social Connections: Not on file   Family History: Family History  Problem Relation Age of Onset  . Throat cancer Father   . Heart block Brother   . Alcohol abuse Brother    Allergies: No Known Allergies Medications: See med rec.  Review of Systems: See HPI for pertinent positives and negatives.   Objective:    General: Well Developed, well nourished, and in no acute distress.  Neuro: Alert and oriented x3.  HEENT: Normocephalic, atraumatic.  Skin: Warm and dry. Cardiac: Regular rate and rhythm, no murmurs rubs or gallops, no lower extremity edema.  Respiratory: Clear to auscultation bilaterally. Not using accessory muscles, speaking in full sentences.   Impression and Recommendations:    1. Adult ADHD Doing well on current regimen. Continue Ritalin 10mg  three times daily as needed for ADHD.  - methylphenidate (RITALIN) 10 MG tablet; Take 1 tablet (10 mg total) by mouth 3 (three) times daily as needed.  Dispense: 90 tablet; Refill: 0 - methylphenidate (RITALIN) 10 MG tablet; Take 1 tablet (10 mg total) by mouth 3 (three) times daily as needed.  Dispense: 90 tablet; Refill: 0 - methylphenidate (RITALIN) 10 MG tablet; Take 1 tablet (10 mg total) by mouth 3 (three) times daily as needed.  Dispense:  90 tablet; Refill: 0  Return in about 3 months (around 02/08/2021) for ADHD follow up. ___________________________________________ Thayer Ohm, DNP, APRN, FNP-BC Primary Care and Sports Medicine [facility]

## 2020-11-18 ENCOUNTER — Other Ambulatory Visit (HOSPITAL_BASED_OUTPATIENT_CLINIC_OR_DEPARTMENT_OTHER): Payer: Self-pay

## 2020-12-19 ENCOUNTER — Other Ambulatory Visit (HOSPITAL_BASED_OUTPATIENT_CLINIC_OR_DEPARTMENT_OTHER): Payer: Self-pay

## 2020-12-23 ENCOUNTER — Other Ambulatory Visit (HOSPITAL_BASED_OUTPATIENT_CLINIC_OR_DEPARTMENT_OTHER): Payer: Self-pay

## 2020-12-27 ENCOUNTER — Encounter: Payer: Self-pay | Admitting: Medical-Surgical

## 2021-01-12 ENCOUNTER — Other Ambulatory Visit: Payer: Self-pay | Admitting: Medical-Surgical

## 2021-01-12 ENCOUNTER — Encounter: Payer: Self-pay | Admitting: Medical-Surgical

## 2021-01-12 DIAGNOSIS — F40243 Fear of flying: Secondary | ICD-10-CM

## 2021-01-13 MED ORDER — ALPRAZOLAM 1 MG PO TABS: 0.5000 mg | ORAL_TABLET | Freq: Two times a day (BID) | ORAL | 0 refills | Status: DC | PRN

## 2021-01-18 ENCOUNTER — Other Ambulatory Visit: Payer: Self-pay | Admitting: Medical-Surgical

## 2021-01-18 ENCOUNTER — Other Ambulatory Visit (HOSPITAL_BASED_OUTPATIENT_CLINIC_OR_DEPARTMENT_OTHER): Payer: Self-pay

## 2021-01-18 DIAGNOSIS — E782 Mixed hyperlipidemia: Secondary | ICD-10-CM

## 2021-01-18 MED ORDER — ATORVASTATIN CALCIUM 20 MG PO TABS
20.0000 mg | ORAL_TABLET | Freq: Every day | ORAL | 0 refills | Status: DC
  Filled 2021-01-18: qty 30, 30d supply, fill #0

## 2021-01-21 ENCOUNTER — Encounter: Payer: Self-pay | Admitting: Medical-Surgical

## 2021-02-08 ENCOUNTER — Ambulatory Visit: Payer: 59 | Admitting: Medical-Surgical

## 2021-06-14 ENCOUNTER — Other Ambulatory Visit: Payer: Self-pay | Admitting: Medical-Surgical

## 2021-06-14 DIAGNOSIS — F909 Attention-deficit hyperactivity disorder, unspecified type: Secondary | ICD-10-CM

## 2021-06-14 DIAGNOSIS — E782 Mixed hyperlipidemia: Secondary | ICD-10-CM

## 2021-06-15 ENCOUNTER — Other Ambulatory Visit (HOSPITAL_BASED_OUTPATIENT_CLINIC_OR_DEPARTMENT_OTHER): Payer: Self-pay

## 2021-06-15 ENCOUNTER — Other Ambulatory Visit: Payer: Self-pay | Admitting: Medical-Surgical

## 2021-06-15 DIAGNOSIS — F909 Attention-deficit hyperactivity disorder, unspecified type: Secondary | ICD-10-CM

## 2021-06-15 MED ORDER — ATORVASTATIN CALCIUM 20 MG PO TABS
20.0000 mg | ORAL_TABLET | Freq: Every day | ORAL | 0 refills | Status: DC
  Filled 2021-06-15: qty 15, 15d supply, fill #0

## 2021-06-15 NOTE — Telephone Encounter (Signed)
Last OV 11/08/20

## 2021-06-16 ENCOUNTER — Other Ambulatory Visit (HOSPITAL_BASED_OUTPATIENT_CLINIC_OR_DEPARTMENT_OTHER): Payer: Self-pay

## 2021-06-20 ENCOUNTER — Ambulatory Visit: Payer: 59 | Admitting: Physician Assistant

## 2021-06-27 ENCOUNTER — Ambulatory Visit (INDEPENDENT_AMBULATORY_CARE_PROVIDER_SITE_OTHER): Payer: 59 | Admitting: Medical-Surgical

## 2021-06-27 ENCOUNTER — Other Ambulatory Visit (HOSPITAL_BASED_OUTPATIENT_CLINIC_OR_DEPARTMENT_OTHER): Payer: Self-pay

## 2021-06-27 ENCOUNTER — Encounter: Payer: Self-pay | Admitting: Medical-Surgical

## 2021-06-27 ENCOUNTER — Other Ambulatory Visit: Payer: Self-pay

## 2021-06-27 VITALS — BP 119/81 | HR 77 | Resp 20 | Ht 75.0 in | Wt 203.0 lb

## 2021-06-27 DIAGNOSIS — E782 Mixed hyperlipidemia: Secondary | ICD-10-CM | POA: Diagnosis not present

## 2021-06-27 DIAGNOSIS — F909 Attention-deficit hyperactivity disorder, unspecified type: Secondary | ICD-10-CM

## 2021-06-27 MED ORDER — METHYLPHENIDATE HCL 10 MG PO TABS
10.0000 mg | ORAL_TABLET | Freq: Three times a day (TID) | ORAL | 0 refills | Status: DC | PRN
  Filled 2021-08-10: qty 90, 30d supply, fill #0

## 2021-06-27 MED ORDER — ATORVASTATIN CALCIUM 20 MG PO TABS
20.0000 mg | ORAL_TABLET | Freq: Every day | ORAL | 0 refills | Status: DC
  Filled 2021-06-27 – 2021-07-06 (×2): qty 90, 90d supply, fill #0

## 2021-06-27 MED ORDER — METHYLPHENIDATE HCL 10 MG PO TABS
10.0000 mg | ORAL_TABLET | Freq: Three times a day (TID) | ORAL | 0 refills | Status: DC | PRN
  Filled 2021-06-27 – 2021-07-06 (×2): qty 90, 30d supply, fill #0

## 2021-06-27 MED ORDER — METHYLPHENIDATE HCL 10 MG PO TABS: 10.0000 mg | ORAL_TABLET | Freq: Three times a day (TID) | ORAL | 0 refills | Status: DC | PRN

## 2021-06-27 NOTE — Progress Notes (Signed)
° °  Established patient office visit  HPI with pertinent ROS:   CC: ADHD follow-up  HPI: Pleasant 41 year old male presenting today for ADHD follow-up.  Has been using Ritalin 10 mg 3 times daily on workdays and 1-2 times daily on other days.  Has been on this regimen for several months and is tolerating it well without side effects.  Feels the medication works well for him and helps to increase his focus.  Denies palpitations, weight fluctuations, sleep pattern changes, and appetite loss.  I reviewed the past medical history, family history, social history, surgical history, and allergies today and no changes were needed.  Please see the problem list section below in epic for further details.   Brief exam, Assessment, and Plan:   Today's Vitals: BP 119/81 (BP Location: Left Arm, Patient Position: Sitting, Cuff Size: Normal)    Pulse 77    Resp 20    Ht 6\' 3"  (1.905 m)    Wt 203 lb (92.1 kg)    SpO2 100%    BMI 25.37 kg/m   1. Adult ADHD Alert, oriented, pleasant and interactive.  He is doing very well on his current regimen of Ritalin and has no complaints about the medication.  Continue Ritalin 10 mg 3 times daily as needed.  Refills sent to pharmacy.  2. Mixed hyperlipidemia Continue atorvastatin as prescribed.  We will plan to recheck his cholesterol at his next visit.  Return in about 3 months (around 09/25/2021) for ADHD follow up. ___________________________________________ 09/27/2021, DNP, APRN, FNP-BC Primary Care and Sports Medicine [facility]

## 2021-07-06 ENCOUNTER — Other Ambulatory Visit (HOSPITAL_BASED_OUTPATIENT_CLINIC_OR_DEPARTMENT_OTHER): Payer: Self-pay

## 2021-07-20 ENCOUNTER — Encounter: Payer: Self-pay | Admitting: Family Medicine

## 2021-07-20 ENCOUNTER — Ambulatory Visit (INDEPENDENT_AMBULATORY_CARE_PROVIDER_SITE_OTHER): Payer: 59

## 2021-07-20 ENCOUNTER — Other Ambulatory Visit: Payer: Self-pay

## 2021-07-20 ENCOUNTER — Ambulatory Visit: Payer: 59 | Admitting: Family Medicine

## 2021-07-20 ENCOUNTER — Other Ambulatory Visit (HOSPITAL_BASED_OUTPATIENT_CLINIC_OR_DEPARTMENT_OTHER): Payer: Self-pay

## 2021-07-20 VITALS — BP 128/83 | HR 75 | Temp 98.1°F | Ht 75.0 in | Wt 204.0 lb

## 2021-07-20 DIAGNOSIS — M549 Dorsalgia, unspecified: Secondary | ICD-10-CM

## 2021-07-20 DIAGNOSIS — M546 Pain in thoracic spine: Secondary | ICD-10-CM | POA: Diagnosis not present

## 2021-07-20 MED ORDER — CYCLOBENZAPRINE HCL 10 MG PO TABS
10.0000 mg | ORAL_TABLET | Freq: Three times a day (TID) | ORAL | 0 refills | Status: DC | PRN
  Filled 2021-07-20: qty 30, 10d supply, fill #0

## 2021-07-20 MED ORDER — PREDNISONE 50 MG PO TABS
ORAL_TABLET | ORAL | 0 refills | Status: DC
  Filled 2021-07-20: qty 5, 5d supply, fill #0

## 2021-07-20 NOTE — Progress Notes (Signed)
Jeremy Peck - 41 y.o. male MRN [id number]  Date of birth: 1980-09-18  Subjective Chief Complaint  Patient presents with   Back Pain    HPI Jeremy Peck is a 41 year old male here today with complaint of back pain.  Reports he was bending over to do something a couple of days ago and had sudden pain in his mid back.  Pain is worse with movement.  He does have some radiation into the rib cage area.  He has had mildly elevated temp for the past couple nights as well.  There is no radiation into extremities.  He denies any urinary symptoms, nausea or cold or flulike symptoms.  ROS:  A comprehensive ROS was completed and negative except as noted per HPI  No Known Allergies  Past Medical History:  Diagnosis Date   Anxiety    Hyperlipidemia    Pericarditis    Syncope and collapse 2001   ? seizure    Past Surgical History:  Procedure Laterality Date   TONSILLECTOMY      Social History   Socioeconomic History   Marital status: Single    Spouse name: Not on file   Number of children: Not on file   Years of education: Not on file   Highest education level: Not on file  Occupational History   Not on file  Tobacco Use   Smoking status: Former   Smokeless tobacco: Never  Vaping Use   Vaping Use: Never used  Substance and Sexual Activity   Alcohol use: Yes    Comment: Rarely   Drug use: No   Sexual activity: Yes    Birth control/protection: None  Other Topics Concern   Not on file  Social History Narrative   Not on file   Social Determinants of Health   Financial Resource Strain: Not on file  Food Insecurity: Not on file  Transportation Needs: Not on file  Physical Activity: Not on file  Stress: Not on file  Social Connections: Not on file    Family History  Problem Relation Age of Onset   Throat cancer Father    Heart block Brother    Alcohol abuse Brother     Health Maintenance  Topic Date Due   COVID-19 Vaccine (3 - Pfizer risk series) 03/03/2020    TETANUS/TDAP  06/27/2022 (Originally 11/20/2020)   Hepatitis C Screening  02/08/2025 (Originally 09/03/1999)   INFLUENZA VACCINE  Completed   HIV Screening  Completed   HPV VACCINES  Aged Out     ----------------------------------------------------------------------------------------------------------------------------------------------------------------------------------------------------------------- Physical Exam BP 128/83 (BP Location: Left Arm, Patient Position: Sitting, Cuff Size: Normal)    Pulse 75    Temp 98.1 F (36.7 C) (Oral)    Ht 6\' 3"  (1.905 m)    Wt 204 lb (92.5 kg)    SpO2 100%    BMI 25.50 kg/m   Physical Exam Constitutional:      Appearance: Normal appearance.  Eyes:     General: No scleral icterus. Cardiovascular:     Rate and Rhythm: Normal rate and regular rhythm.  Pulmonary:     Effort: Pulmonary effort is normal.     Breath sounds: Normal breath sounds.  Abdominal:     Tenderness: There is no right CVA tenderness or left CVA tenderness.  Musculoskeletal:     Cervical back: Neck supple.     Comments: Range of motion is fairly good throughout.  He does have increased pain with extension.  Neurological:     General: No  focal deficit present.     Mental Status: He is alert.  Psychiatric:        Mood and Affect: Mood normal.        Behavior: Behavior normal.    ------------------------------------------------------------------------------------------------------------------------------------------------------------------------------------------------------------------- Assessment and Plan  Mid back pain on right side X-rays of thoracic spine ordered due to acute onset as well as associated fevers.  Adding course of prednisone and muscle relaxer.  We discussed monitoring for any red flag symptoms including development of urinary symptoms and/or flulike symptoms.   Meds ordered this encounter  Medications   predniSONE (DELTASONE) 50 MG tablet    Sig: Take  1 tablet by mouth daily for 5 days    Dispense:  5 tablet    Refill:  0   cyclobenzaprine (FLEXERIL) 10 MG tablet    Sig: Take 1 tablet (10 mg total) by mouth 3 (three) times daily as needed for muscle spasms.    Dispense:  30 tablet    Refill:  0    No follow-ups on file.    This visit occurred during the SARS-CoV-2 public health emergency.  Safety protocols were in place, including screening questions prior to the visit, additional usage of staff PPE, and extensive cleaning of exam room while observing appropriate contact time as indicated for disinfecting solutions.

## 2021-07-20 NOTE — Patient Instructions (Signed)
Muscle Cramps and Spasms Muscle cramps and spasms are when muscles tighten by themselves. They usually get better within minutes. Muscle cramps are painful. They are usually stronger and last longer than muscle spasms. Muscle spasms may or may not be painful. They can last a few seconds or much longer. Cramps and spasms can affect any muscle, but they occur most often in the calf muscles of the leg. They are usually not caused by a serious problem. In many cases, the cause is not known. Some common causes include: Doing more physical work or exercise than your body is ready for. Using the muscles too much (overuse) by repeating certain movements too many times. Staying in a certain position for a long time. Playing a sport or doing an activity without preparing properly. Using bad form or technique while playing a sport or doing an activity. Not having enough water in your body (dehydration). Injury. Side effects of some medicines. Low levels of the salts and minerals in your blood (electrolytes), such as low potassium or calcium. Follow these instructions at home: Managing pain and stiffness   Massage, stretch, and relax the muscle. Do this for many minutes at a time. If told, put heat on tight or tense muscles as often as told by your doctor. Use the heat source that your doctor recommends, such as a moist heat pack or a heating pad. Place a towel between your skin and the heat source. Leave the heat on for 20-30 minutes. Remove the heat if your skin turns bright red. This is very important if you are not able to feel pain, heat, or cold. You may have a greater risk of getting burned. If told, put ice on the affected area. This may help if you are sore or have pain after a cramp or spasm. Put ice in a plastic bag. Place a towel between your skin and the bag. Leave the ice on for 20 minutes, 2-3 times a day. Try taking hot showers or baths to help relax tight muscles. Eating and  drinking Drink enough fluid to keep your pee (urine) pale yellow. Eat a healthy diet to help ensure that your muscles work well. This should include: Fruits and vegetables. Lean protein. Whole grains. Low-fat or nonfat dairy products. General instructions If you are having cramps often, avoid intense exercise for several days. Take over-the-counter and prescription medicines only as told by your doctor. Watch for any changes in your symptoms. Keep all follow-up visits as told by your doctor. This is important. Contact a doctor if: Your cramps or spasms get worse or happen more often. Your cramps or spasms do not get better with time. Summary Muscle cramps and spasms are when muscles tighten by themselves. They usually get better within minutes. Cramps and spasms occur most often in the calf muscles of the leg. Massage, stretch, and relax the muscle. This may help the cramp or spasm go away. Drink enough fluid to keep your pee (urine) pale yellow. This information is not intended to replace advice given to you by your health care provider. Make sure you discuss any questions you have with your health care provider. Document Revised: 12/16/2020 Document Reviewed: 12/16/2020 Elsevier Patient Education  2022 Elsevier Inc.  

## 2021-07-20 NOTE — Assessment & Plan Note (Signed)
X-rays of thoracic spine ordered due to acute onset as well as associated fevers.  Adding course of prednisone and muscle relaxer.  We discussed monitoring for any red flag symptoms including development of urinary symptoms and/or flulike symptoms.

## 2021-08-10 ENCOUNTER — Other Ambulatory Visit (HOSPITAL_BASED_OUTPATIENT_CLINIC_OR_DEPARTMENT_OTHER): Payer: Self-pay

## 2021-08-16 ENCOUNTER — Other Ambulatory Visit (HOSPITAL_BASED_OUTPATIENT_CLINIC_OR_DEPARTMENT_OTHER): Payer: Self-pay

## 2021-09-25 NOTE — Progress Notes (Signed)
?  HPI with pertinent ROS:  ? ?CC: ADHD follow up ? ?HPI: ?Pleasant 41 year old male presenting today for ADHD follow up.  He has been taking Ritalin 10 mg 3 times daily as needed.  On workdays, he usually takes all 3 doses but on days off, he takes 1-2 doses.  Tolerating the medication well without side effects.  No weight fluctuations, sleep pattern changes, or appetite suppression.  Feels the medication is working well for him to keep his focus and allow him to complete tasks at work. ? ?Taking Lipitor 20 mg daily as prescribed, tolerating well without side effects.  Tested himself in the lab a few weeks ago and notes that while his cholesterol looked better it was not quite perfect.  Unfortunately this is not available to [place] in our system but he is amenable to having it rechecked today.  Pharmacy change will not working. ? ?I reviewed the past medical history, family history, social history, surgical history, and allergies today and no changes were needed.  Please see the problem list section below in epic for further details. ? ? ?Physical exam:  ? ?General: Well Developed, well nourished, and in no acute distress.  ?Neuro: Alert and oriented x3.  ?HEENT: Normocephalic, atraumatic.  ?Skin: Warm and dry. ?Cardiac: Regular rate and rhythm, no murmurs rubs or gallops, no lower extremity edema.  ?Respiratory: Clear to auscultation bilaterally. Not using accessory muscles, speaking in full sentences. ? ?Impression and Recommendations:   ? ?1. Adult ADHD ?Continue Ritalin 10 mg 3 times daily as needed. ?- methylphenidate (RITALIN) 10 MG tablet; Take 1 tablet (10 mg total) by mouth 3 (three) times daily as needed.  Dispense: 90 tablet; Refill: 0 ?- methylphenidate (RITALIN) 10 MG tablet; Take 1 tablet (10 mg total) by mouth 3 (three) times daily as needed.  Dispense: 90 tablet; Refill: 0 ?- methylphenidate (RITALIN) 10 MG tablet; Take 1 tablet (10 mg total) by mouth 3 (three) times daily as needed.  Dispense: 90  tablet; Refill: 0 ? ?2. Mixed hyperlipidemia ?Checking lipid panel today.  Continue atorvastatin 20 mg daily. ?- atorvastatin (LIPITOR) 20 MG tablet; Take 1 tablet (20 mg total) by mouth daily at 6 PM.  Dispense: 90 tablet; Refill: 1 ?- Lipid panel ? ?3. Preventative health care ?Checking CBC with differential and CMP. ?- CBC with Differential/Platelet ?- COMPLETE METABOLIC PANEL WITH GFR ? ?4. Need for tetanus booster ?Tdap updated today. ?- Tdap vaccine greater than or equal to 7yo IM ? ?Return in about 6 months (around 03/28/2022) for ADHD follow up. ?___________________________________________ ?Thayer Ohm, DNP, APRN, FNP-BC ?Primary Care and Sports Medicine ?[facility] ?

## 2021-09-26 ENCOUNTER — Encounter: Payer: Self-pay | Admitting: Medical-Surgical

## 2021-09-26 ENCOUNTER — Ambulatory Visit: Payer: 59 | Admitting: Medical-Surgical

## 2021-09-26 ENCOUNTER — Other Ambulatory Visit (HOSPITAL_BASED_OUTPATIENT_CLINIC_OR_DEPARTMENT_OTHER): Payer: Self-pay

## 2021-09-26 DIAGNOSIS — E782 Mixed hyperlipidemia: Secondary | ICD-10-CM

## 2021-09-26 DIAGNOSIS — Z0000 Encounter for general adult medical examination without abnormal findings: Secondary | ICD-10-CM

## 2021-09-26 DIAGNOSIS — Z23 Encounter for immunization: Secondary | ICD-10-CM

## 2021-09-26 DIAGNOSIS — F909 Attention-deficit hyperactivity disorder, unspecified type: Secondary | ICD-10-CM

## 2021-09-26 MED ORDER — ATORVASTATIN CALCIUM 20 MG PO TABS
20.0000 mg | ORAL_TABLET | Freq: Every day | ORAL | 1 refills | Status: DC
  Filled 2021-09-26: qty 90, 90d supply, fill #0
  Filled 2022-01-22: qty 90, 90d supply, fill #1

## 2021-09-26 MED ORDER — METHYLPHENIDATE HCL 10 MG PO TABS: 10.0000 mg | ORAL_TABLET | Freq: Three times a day (TID) | ORAL | 0 refills | Status: DC | PRN

## 2021-09-26 MED ORDER — METHYLPHENIDATE HCL 10 MG PO TABS
10.0000 mg | ORAL_TABLET | Freq: Three times a day (TID) | ORAL | 0 refills | Status: DC | PRN
Start: 2021-09-26 — End: 2021-11-30
  Filled 2021-09-26: qty 90, 30d supply, fill #0

## 2021-09-27 LAB — COMPLETE METABOLIC PANEL WITH GFR
AG Ratio: 1.7 (calc) (ref 1.0–2.5)
ALT: 15 U/L (ref 9–46)
AST: 22 U/L (ref 10–40)
Albumin: 4.9 g/dL (ref 3.6–5.1)
Alkaline phosphatase (APISO): 65 U/L (ref 36–130)
BUN: 18 mg/dL (ref 7–25)
CO2: 30 mmol/L (ref 20–32)
Calcium: 9.6 mg/dL (ref 8.6–10.3)
Chloride: 103 mmol/L (ref 98–110)
Creat: 1.17 mg/dL (ref 0.60–1.29)
Globulin: 2.9 g/dL (calc) (ref 1.9–3.7)
Glucose, Bld: 89 mg/dL (ref 65–99)
Potassium: 4.3 mmol/L (ref 3.5–5.3)
Sodium: 139 mmol/L (ref 135–146)
Total Bilirubin: 0.7 mg/dL (ref 0.2–1.2)
Total Protein: 7.8 g/dL (ref 6.1–8.1)
eGFR: 81 mL/min/{1.73_m2} (ref >=60)

## 2021-09-27 LAB — LIPID PANEL
Cholesterol: 153 mg/dL (ref <200)
HDL: 47 mg/dL (ref >=40)
LDL Cholesterol (Calc): 89 mg/dL (calc)
Non-HDL Cholesterol (Calc): 106 mg/dL (calc) (ref <130)
Total CHOL/HDL Ratio: 3.3 (calc) (ref <5.0)
Triglycerides: 78 mg/dL (ref <150)

## 2021-09-27 LAB — CBC WITH DIFFERENTIAL/PLATELET
Absolute Monocytes: 502 cells/uL (ref 200–950)
Basophils Absolute: 51 cells/uL (ref 0–200)
Basophils Relative: 0.6 %
Eosinophils Absolute: 272 cells/uL (ref 15–500)
Eosinophils Relative: 3.2 %
HCT: 46.5 % (ref 38.5–50.0)
Hemoglobin: 15.3 g/dL (ref 13.2–17.1)
Lymphs Abs: 2049 cells/uL (ref 850–3900)
MCH: 28.1 pg (ref 27.0–33.0)
MCHC: 32.9 g/dL (ref 32.0–36.0)
MCV: 85.3 fL (ref 80.0–100.0)
MPV: 10.4 fL (ref 7.5–12.5)
Monocytes Relative: 5.9 %
Neutro Abs: 5627 cells/uL (ref 1500–7800)
Neutrophils Relative %: 66.2 %
Platelets: 249 10*3/uL (ref 140–400)
RBC: 5.45 10*6/uL (ref 4.20–5.80)
RDW: 13.3 % (ref 11.0–15.0)
Total Lymphocyte: 24.1 %
WBC: 8.5 10*3/uL (ref 3.8–10.8)

## 2021-10-02 ENCOUNTER — Other Ambulatory Visit (HOSPITAL_BASED_OUTPATIENT_CLINIC_OR_DEPARTMENT_OTHER): Payer: Self-pay

## 2021-10-28 ENCOUNTER — Encounter (INDEPENDENT_AMBULATORY_CARE_PROVIDER_SITE_OTHER): Payer: 59 | Admitting: Medical-Surgical

## 2021-10-28 DIAGNOSIS — F430 Acute stress reaction: Secondary | ICD-10-CM

## 2021-10-30 ENCOUNTER — Other Ambulatory Visit: Payer: Self-pay | Admitting: Medical-Surgical

## 2021-10-30 MED ORDER — ALPRAZOLAM 0.5 MG PO TABS: 0.5000 mg | ORAL_TABLET | Freq: Two times a day (BID) | ORAL | 0 refills | Status: DC | PRN

## 2021-10-30 NOTE — Telephone Encounter (Signed)

## 2021-10-31 ENCOUNTER — Other Ambulatory Visit: Payer: Self-pay | Admitting: Medical-Surgical

## 2021-10-31 MED ORDER — HYDROXYZINE HCL 50 MG PO TABS: 25.0000 mg | ORAL_TABLET | Freq: Every evening | ORAL | 3 refills | Status: DC | PRN

## 2021-11-01 ENCOUNTER — Other Ambulatory Visit (HOSPITAL_BASED_OUTPATIENT_CLINIC_OR_DEPARTMENT_OTHER): Payer: Self-pay

## 2021-11-30 ENCOUNTER — Other Ambulatory Visit: Payer: Self-pay | Admitting: Medical-Surgical

## 2021-11-30 DIAGNOSIS — F909 Attention-deficit hyperactivity disorder, unspecified type: Secondary | ICD-10-CM

## 2021-12-01 ENCOUNTER — Other Ambulatory Visit (HOSPITAL_BASED_OUTPATIENT_CLINIC_OR_DEPARTMENT_OTHER): Payer: Self-pay

## 2021-12-01 MED ORDER — METHYLPHENIDATE HCL 10 MG PO TABS
10.0000 mg | ORAL_TABLET | Freq: Three times a day (TID) | ORAL | 0 refills | Status: DC | PRN
  Filled 2021-12-01: qty 90, 30d supply, fill #0

## 2021-12-08 ENCOUNTER — Other Ambulatory Visit: Payer: Self-pay | Admitting: Medical-Surgical

## 2022-01-22 ENCOUNTER — Other Ambulatory Visit (HOSPITAL_BASED_OUTPATIENT_CLINIC_OR_DEPARTMENT_OTHER): Payer: Self-pay

## 2022-01-22 ENCOUNTER — Other Ambulatory Visit: Payer: Self-pay | Admitting: Family Medicine

## 2022-01-22 DIAGNOSIS — F909 Attention-deficit hyperactivity disorder, unspecified type: Secondary | ICD-10-CM

## 2022-01-22 MED ORDER — METHYLPHENIDATE HCL 10 MG PO TABS
10.0000 mg | ORAL_TABLET | Freq: Three times a day (TID) | ORAL | 0 refills | Status: DC | PRN
  Filled 2022-01-22: qty 90, 30d supply, fill #0

## 2022-03-29 ENCOUNTER — Ambulatory Visit: Payer: 59 | Admitting: Medical-Surgical

## 2022-03-29 ENCOUNTER — Encounter: Payer: Self-pay | Admitting: Medical-Surgical

## 2022-03-29 DIAGNOSIS — F909 Attention-deficit hyperactivity disorder, unspecified type: Secondary | ICD-10-CM | POA: Diagnosis not present

## 2022-03-29 MED ORDER — METHYLPHENIDATE HCL 10 MG PO TABS: 10.0000 mg | ORAL_TABLET | Freq: Three times a day (TID) | ORAL | 0 refills | Status: DC | PRN

## 2022-03-29 NOTE — Progress Notes (Signed)
Medical screening examination/treatment was performed by qualified nurse practitioner student and as supervising provider I was immediately available for consultation/collaboration. I have reviewed documentation and agree with assessment and plan. ° °Neta Upadhyay L. Zeppelin Commisso, DNP, APRN, FNP-BC °[facility]  °Primary Care and Sports Medicine ° °

## 2022-03-29 NOTE — Progress Notes (Signed)
Established Patient Office Visit  Subjective   Patient ID: Jeremy Peck, male    DOB: 1981-01-25  Age: 41 y.o. MRN: [id number]  Chief Complaint  Patient presents with   ADHD    Follow Up going good no side effects on meds.    HPI  Jeremy Peck is here today for routine follow up on his Attention-Deficit Hyperactivity Disorder. He takes Ritalin 10 mg three times daily. He reports no issue with this medication and is working well for him. He denies any palpitations or any problem with sleep.   He reports taking Alprazolam 0.5 mg as needed, and only used it about 1-2 times recently or if flying. He has had some life stressors and going through a divorce, currently separated from his wife but otherwise improving. He also mentioned an upcoming trip to [place] with his brother. He stated that he's been doing some virtual counseling as well and this has been helpful for him. He denies any thoughts of harming himself or others.   Review of Systems  Constitutional: Negative.   HENT: Negative.    Eyes: Negative.   Respiratory: Negative.    Cardiovascular: Negative.   Gastrointestinal: Negative.   Genitourinary: Negative.   Musculoskeletal: Negative.   Skin: Negative.   Neurological: Negative.   Endo/Heme/Allergies: Negative.   Psychiatric/Behavioral: Negative.        Objective:     BP 129/80   Pulse 65   Ht 6\' 2"  (1.88 m)   Wt 88.5 kg   SpO2 99%   BMI 25.04 kg/m    Physical Exam Constitutional:      General: He is not in acute distress.    Appearance: Normal appearance. He is not ill-appearing, toxic-appearing or diaphoretic.  HENT:     Head: Normocephalic and atraumatic.  Eyes:     Conjunctiva/sclera: Conjunctivae normal.  Cardiovascular:     Rate and Rhythm: Normal rate and regular rhythm.     Pulses: Normal pulses.     Heart sounds: Normal heart sounds.  Pulmonary:     Effort: Pulmonary effort is normal.     Breath sounds: Normal breath sounds.  Skin:     General: Skin is warm and dry.  Neurological:     General: No focal deficit present.     Mental Status: He is alert and oriented to person, place, and time. Mental status is at baseline.  Psychiatric:        Mood and Affect: Mood normal.        Behavior: Behavior normal.        Thought Content: Thought content normal.        Judgment: Judgment normal.      No results found for any visits on 03/29/22.    The 10-year ASCVD risk score (Arnett DK, et al., 2019) is: 0.7%    Assessment & Plan:   1. Adult ADHD He is tolerating this medication very well and will continue as prescribed.  - methylphenidate (RITALIN) 10 MG tablet; Take 1 tablet (10 mg total) by mouth 3 (three) times daily as needed.  Dispense: 90 tablet; Refill: 0 - methylphenidate (RITALIN) 10 MG tablet; Take 1 tablet (10 mg total) by mouth 3 (three) times daily as needed.  Dispense: 90 tablet; Refill: 0 - methylphenidate (RITALIN) 10 MG tablet; Take 1 tablet (10 mg total) by mouth 3 (three) times daily as needed.  Dispense: 90 tablet; Refill: 0    Return in about 6 months (around 09/28/2022) for ADHD follow  up.    Loretha Stapler, RN

## 2022-04-29 ENCOUNTER — Encounter: Payer: Self-pay | Admitting: Medical-Surgical

## 2022-05-08 ENCOUNTER — Other Ambulatory Visit: Payer: Self-pay | Admitting: Medical-Surgical

## 2022-05-08 DIAGNOSIS — E782 Mixed hyperlipidemia: Secondary | ICD-10-CM

## 2022-05-08 DIAGNOSIS — F909 Attention-deficit hyperactivity disorder, unspecified type: Secondary | ICD-10-CM

## 2022-05-08 MED ORDER — ATORVASTATIN CALCIUM 20 MG PO TABS: 20.0000 mg | ORAL_TABLET | Freq: Every day | ORAL | 1 refills | Status: DC

## 2022-05-08 NOTE — Telephone Encounter (Signed)
Prescription is already on file at the pharmacy, will not let me deny this because it is controlled. Please sign denial.

## 2022-05-28 ENCOUNTER — Other Ambulatory Visit: Payer: Self-pay | Admitting: Medical-Surgical

## 2022-05-28 DIAGNOSIS — F909 Attention-deficit hyperactivity disorder, unspecified type: Secondary | ICD-10-CM

## 2022-05-29 NOTE — Telephone Encounter (Signed)
Prescription already on file at his pharmacy and was available to fill on 05/28/2022.

## 2022-05-29 NOTE — Telephone Encounter (Signed)
Duplicate request - unable to refuse auto refill request. 

## 2022-05-31 ENCOUNTER — Encounter: Payer: Self-pay | Admitting: Medical-Surgical

## 2022-05-31 ENCOUNTER — Other Ambulatory Visit (HOSPITAL_BASED_OUTPATIENT_CLINIC_OR_DEPARTMENT_OTHER): Payer: Self-pay

## 2022-05-31 ENCOUNTER — Other Ambulatory Visit: Payer: Self-pay | Admitting: Medical-Surgical

## 2022-05-31 DIAGNOSIS — F909 Attention-deficit hyperactivity disorder, unspecified type: Secondary | ICD-10-CM

## 2022-05-31 MED ORDER — METHYLPHENIDATE HCL 10 MG PO TABS
10.0000 mg | ORAL_TABLET | Freq: Three times a day (TID) | ORAL | 0 refills | Status: DC | PRN
  Filled 2022-05-31: qty 90, 30d supply, fill #0

## 2022-05-31 NOTE — Telephone Encounter (Signed)
Please contact [facility] on [address] and cancel the Ritalin prescription for December. It has been resent to the [facility] per patient request.

## 2022-07-30 ENCOUNTER — Other Ambulatory Visit: Payer: Self-pay | Admitting: Medical-Surgical

## 2022-07-30 DIAGNOSIS — F909 Attention-deficit hyperactivity disorder, unspecified type: Secondary | ICD-10-CM

## 2022-07-30 MED ORDER — METHYLPHENIDATE HCL 10 MG PO TABS
10.0000 mg | ORAL_TABLET | Freq: Three times a day (TID) | ORAL | 0 refills | Status: DC | PRN
  Filled 2022-07-30: qty 90, 30d supply, fill #0

## 2022-07-30 NOTE — Telephone Encounter (Signed)
Last seen 03/29/2022  Last written 05/31/2022 #90 with no refills (one month)

## 2022-07-31 ENCOUNTER — Other Ambulatory Visit (HOSPITAL_BASED_OUTPATIENT_CLINIC_OR_DEPARTMENT_OTHER): Payer: Self-pay

## 2022-08-23 ENCOUNTER — Other Ambulatory Visit (HOSPITAL_BASED_OUTPATIENT_CLINIC_OR_DEPARTMENT_OTHER): Payer: Self-pay

## 2022-08-24 ENCOUNTER — Other Ambulatory Visit: Payer: Self-pay

## 2022-08-24 ENCOUNTER — Other Ambulatory Visit (HOSPITAL_COMMUNITY): Payer: Self-pay

## 2022-08-24 MED ORDER — ATORVASTATIN CALCIUM 20 MG PO TABS
20.0000 mg | ORAL_TABLET | Freq: Every day | ORAL | 1 refills | Status: DC
  Filled 2022-08-24: qty 90, 90d supply, fill #0

## 2022-09-28 ENCOUNTER — Ambulatory Visit: Payer: 59 | Admitting: Medical-Surgical

## 2022-10-01 ENCOUNTER — Ambulatory Visit: Payer: 59 | Admitting: Medical-Surgical

## 2022-10-01 ENCOUNTER — Other Ambulatory Visit: Payer: Self-pay

## 2022-10-01 VITALS — BP 119/68 | HR 79 | Resp 20 | Ht 74.0 in | Wt 197.6 lb

## 2022-10-01 DIAGNOSIS — F909 Attention-deficit hyperactivity disorder, unspecified type: Secondary | ICD-10-CM

## 2022-10-01 DIAGNOSIS — I7300 Raynaud's syndrome without gangrene: Secondary | ICD-10-CM | POA: Diagnosis not present

## 2022-10-01 DIAGNOSIS — E782 Mixed hyperlipidemia: Secondary | ICD-10-CM

## 2022-10-01 DIAGNOSIS — R030 Elevated blood-pressure reading, without diagnosis of hypertension: Secondary | ICD-10-CM | POA: Diagnosis not present

## 2022-10-01 MED ORDER — METHYLPHENIDATE HCL 10 MG PO TABS: 10.0000 mg | ORAL_TABLET | Freq: Three times a day (TID) | ORAL | 0 refills | Status: DC | PRN

## 2022-10-01 MED ORDER — METHYLPHENIDATE HCL 10 MG PO TABS
10.0000 mg | ORAL_TABLET | Freq: Three times a day (TID) | ORAL | 0 refills | Status: DC | PRN
  Filled 2022-10-01: qty 90, 30d supply, fill #0

## 2022-10-01 NOTE — Progress Notes (Signed)
        Established patient visit  History, exam, impression, and plan:  1. Adult ADHD Long-term history of ADHD well-managed with Ritalin 10 mg 3 times daily as needed.  When working, averages all 3 doses during the day and feels like the medication very well to keep his symptoms and check.  No changes in weight, appetite, or sleeping patterns.  Denies side effects.  On stable regimen long-term so continue Ritalin 10 mg 3 times daily as needed. - methylphenidate (RITALIN) 10 MG tablet; Take 1 tablet (10 mg total) by mouth 3 (three) times daily as needed.  Dispense: 90 tablet; Refill: 0 - methylphenidate (RITALIN) 10 MG tablet; Take 1 tablet (10 mg total) by mouth 3 (three) times daily as needed.  Dispense: 90 tablet; Refill: 0 - methylphenidate (RITALIN) 10 MG tablet; Take 1 tablet (10 mg total) by mouth 3 (three) times daily as needed.  Dispense: 90 tablet; Refill: 0  2. Mixed hyperlipidemia Taking atorvastatin 20 mg daily, tolerating well without side effects.  Follows a low-fat heart healthy diet.  Stays physically active.  Updating labs today.  Continue Lipitor as prescribed. - COMPLETE METABOLIC PANEL WITH GFR - Lipid panel  3. Raynaud's phenomenon without gangrene Notes that he suffers from Raynaud's phenomenon that appears to be triggered by cold and caffeine.  Was having episodes affecting both hands frequently so he stopped drinking caffeine.  The episodes have become infrequent and resolve within a few minutes.  No need for further intervention today.  4. White coat syndrome without diagnosis of hypertension History of whitecoat syndrome however blood pressure looks great today at 119/68.  Procedures performed this visit: None.  Return in about 6 months (around 04/02/2023) for ADHD follow up.  __________________________________ Thayer Ohm, DNP, APRN, FNP-BC Primary Care and Sports Medicine [facility]

## 2022-10-02 ENCOUNTER — Other Ambulatory Visit (HOSPITAL_COMMUNITY): Payer: Self-pay

## 2022-10-02 ENCOUNTER — Other Ambulatory Visit: Payer: Self-pay

## 2022-10-02 LAB — COMPLETE METABOLIC PANEL WITH GFR
AG Ratio: 1.8 (calc) (ref 1.0–2.5)
ALT: 16 U/L (ref 9–46)
AST: 19 U/L (ref 10–40)
Albumin: 4.7 g/dL (ref 3.6–5.1)
Alkaline phosphatase (APISO): 58 U/L (ref 36–130)
BUN: 20 mg/dL (ref 7–25)
CO2: 32 mmol/L (ref 20–32)
Calcium: 9.5 mg/dL (ref 8.6–10.3)
Chloride: 103 mmol/L (ref 98–110)
Creat: 1.19 mg/dL (ref 0.60–1.29)
Globulin: 2.6 g/dL (calc) (ref 1.9–3.7)
Glucose, Bld: 103 mg/dL — ABNORMAL HIGH (ref 65–99)
Potassium: 4.3 mmol/L (ref 3.5–5.3)
Sodium: 141 mmol/L (ref 135–146)
Total Bilirubin: 0.8 mg/dL (ref 0.2–1.2)
Total Protein: 7.3 g/dL (ref 6.1–8.1)
eGFR: 79 mL/min/{1.73_m2} (ref >=60)

## 2022-10-02 LAB — LIPID PANEL
Cholesterol: 138 mg/dL (ref <200)
HDL: 35 mg/dL — ABNORMAL LOW (ref >=40)
LDL Cholesterol (Calc): 66 mg/dL (calc)
Non-HDL Cholesterol (Calc): 103 mg/dL (calc) (ref <130)
Total CHOL/HDL Ratio: 3.9 (calc) (ref <5.0)
Triglycerides: 305 mg/dL — ABNORMAL HIGH (ref <150)

## 2022-12-01 ENCOUNTER — Other Ambulatory Visit: Payer: Self-pay | Admitting: Medical-Surgical

## 2022-12-01 DIAGNOSIS — F909 Attention-deficit hyperactivity disorder, unspecified type: Secondary | ICD-10-CM

## 2022-12-03 ENCOUNTER — Other Ambulatory Visit (HOSPITAL_COMMUNITY): Payer: Self-pay

## 2022-12-03 MED ORDER — METHYLPHENIDATE HCL 10 MG PO TABS
10.0000 mg | ORAL_TABLET | Freq: Three times a day (TID) | ORAL | 0 refills | Status: DC | PRN
Start: 2023-01-02 — End: 2023-03-28
  Filled 2023-02-16: qty 90, 30d supply, fill #0

## 2022-12-03 MED ORDER — METHYLPHENIDATE HCL 10 MG PO TABS
10.0000 mg | ORAL_TABLET | Freq: Three times a day (TID) | ORAL | 0 refills | Status: DC | PRN
Start: 2022-12-03 — End: 2023-03-28
  Filled 2022-12-03: qty 90, 30d supply, fill #0

## 2022-12-03 MED ORDER — METHYLPHENIDATE HCL 10 MG PO TABS
10.0000 mg | ORAL_TABLET | Freq: Three times a day (TID) | ORAL | 0 refills | Status: DC | PRN
Start: 2023-02-01 — End: 2023-03-28

## 2022-12-04 ENCOUNTER — Other Ambulatory Visit (HOSPITAL_COMMUNITY): Payer: Self-pay

## 2022-12-25 ENCOUNTER — Telehealth: Payer: Self-pay | Admitting: Medical-Surgical

## 2022-12-25 DIAGNOSIS — E782 Mixed hyperlipidemia: Secondary | ICD-10-CM

## 2022-12-25 NOTE — Telephone Encounter (Signed)
Patient called he is requesting refills on atorvastatin 20mg  for cholesterol  patient is scheduled in October for an appointment Please contact pharmacy [facility] [phone]

## 2022-12-26 ENCOUNTER — Other Ambulatory Visit (HOSPITAL_COMMUNITY): Payer: Self-pay

## 2022-12-26 MED ORDER — ATORVASTATIN CALCIUM 20 MG PO TABS
20.0000 mg | ORAL_TABLET | Freq: Every day | ORAL | 0 refills | Status: DC
Start: 2022-12-26 — End: 2023-03-28
  Filled 2022-12-26: qty 90, 90d supply, fill #0

## 2022-12-26 NOTE — Telephone Encounter (Signed)
Sent to pharmacy for 90 days with out refills

## 2023-02-16 ENCOUNTER — Other Ambulatory Visit (HOSPITAL_COMMUNITY): Payer: Self-pay

## 2023-02-18 ENCOUNTER — Other Ambulatory Visit: Payer: Self-pay

## 2023-03-07 IMAGING — DX DG THORACIC SPINE 3V
4 series · 4 of 4 positions shown · non-contrast
Comparison: None.

CLINICAL DATA: Thoracolumbar back pain.

EXAM:
THORACIC SPINE - 3 VIEWS

[t-spine ap (1 of 2)]
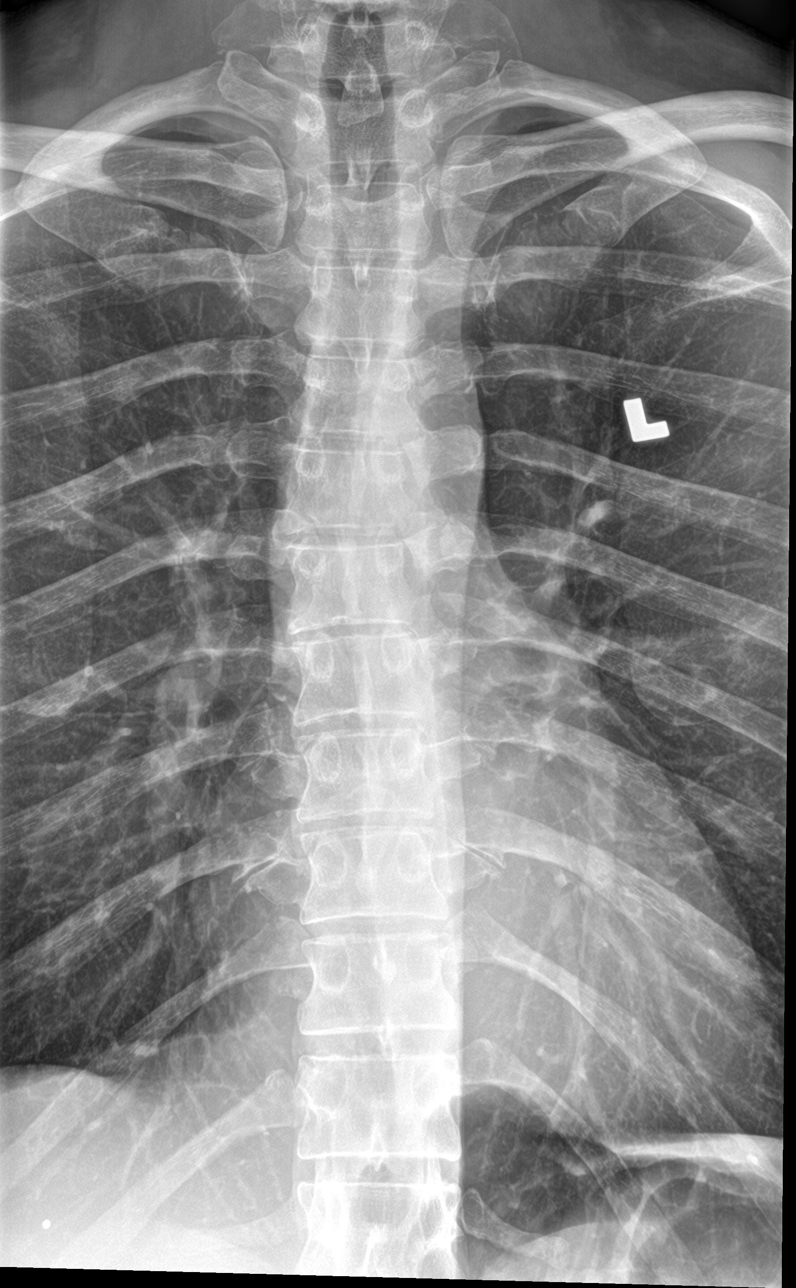

[t-spine lat]
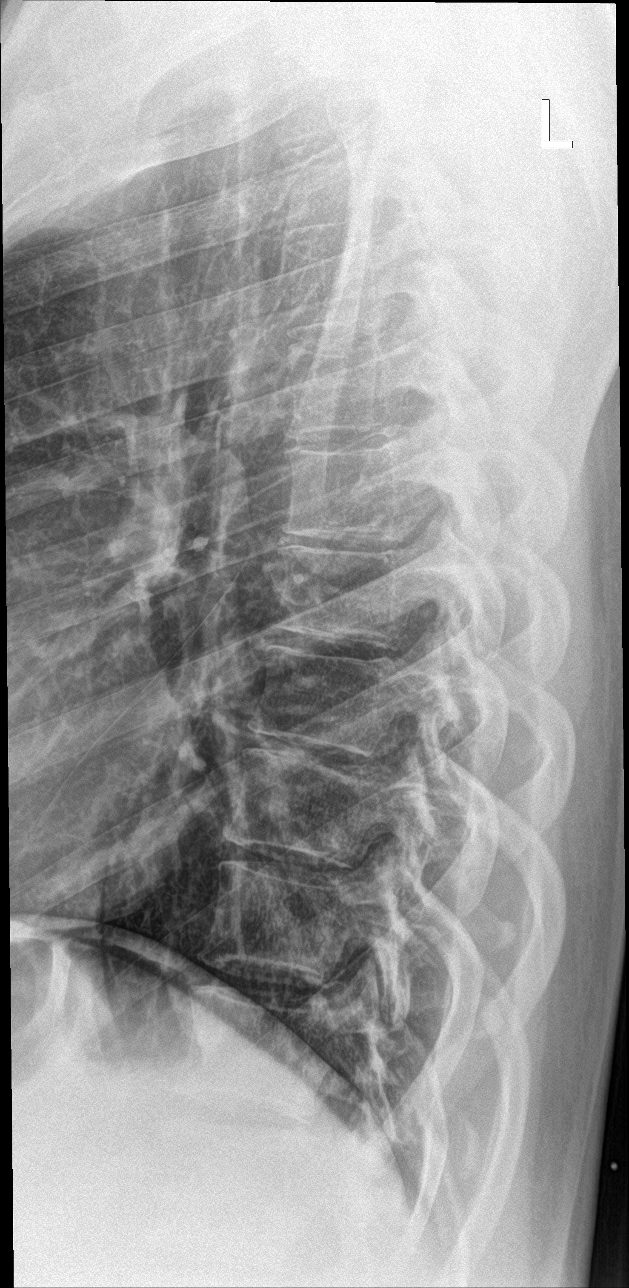

[t-spine swimmers]
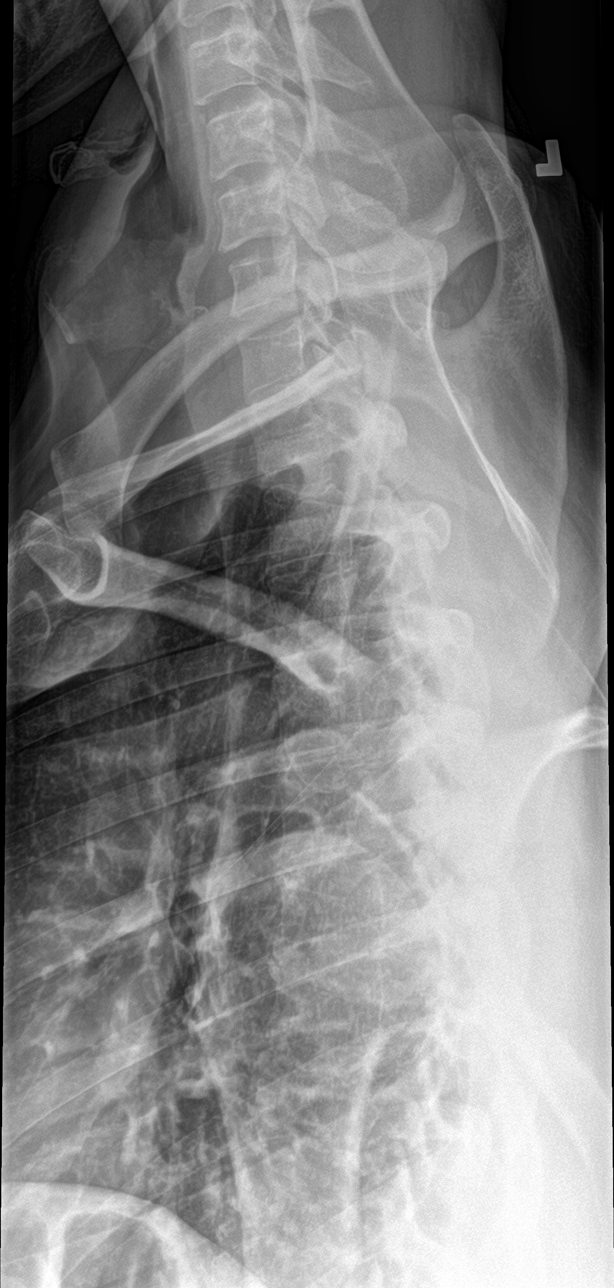

[t-spine ap (2 of 2)]
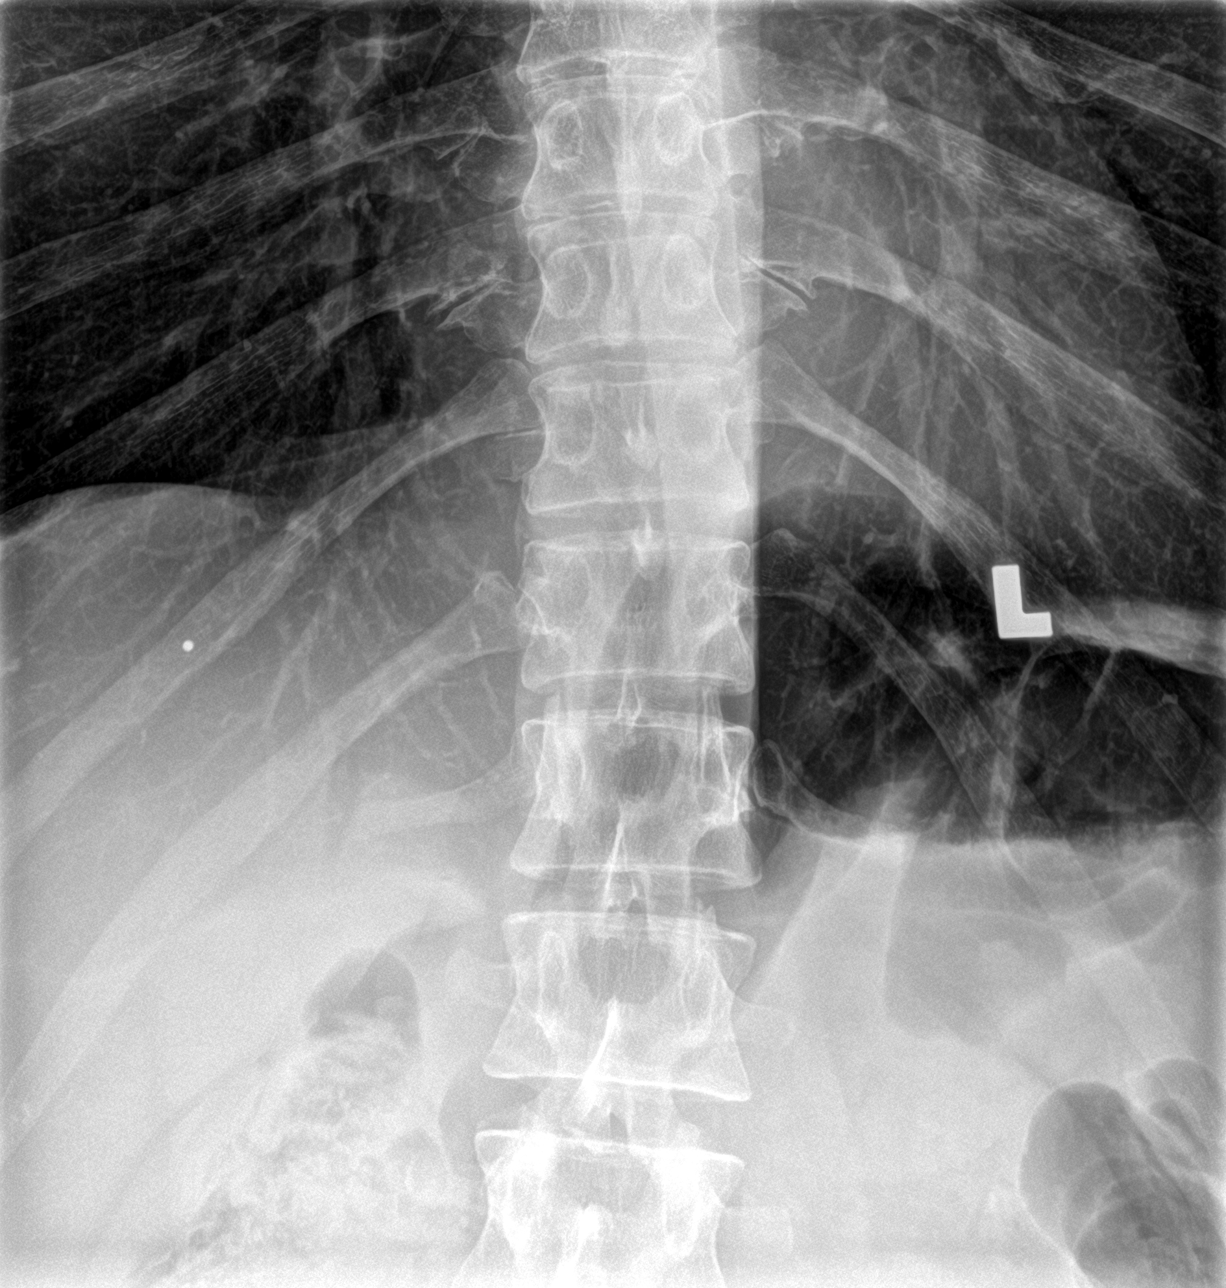

[4 of 4 positions shown; findings below may reference images not displayed]

FINDINGS: There is no evidence of thoracic spine fracture. Alignment is
normal. No other significant bone abnormalities are identified.
IMPRESSION: Negative.

## 2023-03-28 ENCOUNTER — Ambulatory Visit: Payer: 59 | Admitting: Medical-Surgical

## 2023-03-28 ENCOUNTER — Other Ambulatory Visit (HOSPITAL_BASED_OUTPATIENT_CLINIC_OR_DEPARTMENT_OTHER): Payer: Self-pay

## 2023-03-28 ENCOUNTER — Encounter: Payer: Self-pay | Admitting: Medical-Surgical

## 2023-03-28 VITALS — BP 102/67 | HR 68 | Resp 20 | Ht 74.0 in | Wt 197.1 lb

## 2023-03-28 DIAGNOSIS — E782 Mixed hyperlipidemia: Secondary | ICD-10-CM | POA: Diagnosis not present

## 2023-03-28 DIAGNOSIS — F40243 Fear of flying: Secondary | ICD-10-CM | POA: Diagnosis not present

## 2023-03-28 DIAGNOSIS — F909 Attention-deficit hyperactivity disorder, unspecified type: Secondary | ICD-10-CM

## 2023-03-28 MED ORDER — METHYLPHENIDATE HCL 10 MG PO TABS
10.0000 mg | ORAL_TABLET | Freq: Three times a day (TID) | ORAL | 0 refills | Status: DC | PRN
Start: 2023-05-27 — End: 2023-06-14

## 2023-03-28 MED ORDER — ATORVASTATIN CALCIUM 20 MG PO TABS
20.0000 mg | ORAL_TABLET | Freq: Every day | ORAL | 3 refills | Status: DC
Start: 2023-03-28 — End: 2023-06-14
  Filled 2023-03-28: qty 90, 90d supply, fill #0

## 2023-03-28 MED ORDER — METHYLPHENIDATE HCL 10 MG PO TABS
10.0000 mg | ORAL_TABLET | Freq: Three times a day (TID) | ORAL | 0 refills | Status: DC | PRN
Start: 2023-03-28 — End: 2023-06-14
  Filled 2023-03-28: qty 90, 30d supply, fill #0

## 2023-03-28 MED ORDER — ALPRAZOLAM 0.5 MG PO TABS
0.5000 mg | ORAL_TABLET | Freq: Two times a day (BID) | ORAL | 0 refills | Status: AC | PRN
  Filled 2023-03-28: qty 20, 5d supply, fill #0

## 2023-03-28 MED ORDER — METHYLPHENIDATE HCL 10 MG PO TABS
10.0000 mg | ORAL_TABLET | Freq: Three times a day (TID) | ORAL | 0 refills | Status: DC | PRN
Start: 2023-04-27 — End: 2023-06-14

## 2023-03-28 NOTE — Progress Notes (Signed)
        Established patient visit  History, exam, impression, and plan:  1. Adult ADHD Jeremy Peck 42 year old male presenting today with a history of ADHD that has been treated long-term with Ritalin 10 mg 3 times daily as needed.  When he works, he does usually take 3 doses per day.  On days he is off, he takes a couple of doses and some days he even skips it altogether.  Feels medication continues to work very well for him and has no concerns with side effects or intolerances.  No difficulty with sleeping, changes in appetite, or weight fluctuations.  Plan to continue Ritalin 10 mg 3 times daily as needed. - methylphenidate (RITALIN) 10 MG tablet; Take 1 tablet (10 mg) by mouth 3 times daily as needed.  Dispense: 90 tablet; Refill: 0 - methylphenidate (RITALIN) 10 MG tablet; Take 1 tablet (10 mg total) by mouth 3 (three) times daily as needed.  Dispense: 90 tablet; Refill: 0 - methylphenidate (RITALIN) 10 MG tablet; Take 1 tablet (10 mg total) by mouth 3 (three) times daily as needed.  Dispense: 90 tablet; Refill: 0  2. Mixed hyperlipidemia History of mixed hyperlipidemia currently treated with atorvastatin 20 mg daily.  Last lipid panel checked 6 months ago showed increased triglycerides with low HDL.  He would like to have this checked today so ordering lipid panel for repeat evaluation.  Continue atorvastatin as prescribed. - atorvastatin (LIPITOR) 20 MG tablet; Take 1 tablet (20 mg total) by mouth daily at 6 PM.  Dispense: 90 tablet; Refill: 3 - Lipid panel  3. Flying phobia Is going to [place] with his brother and their mom on [address] and will be there for several weeks.  Has a severe flying phobia and has used Xanax as needed for flights in the past.  He is requesting a refill on his medications as well as a letter to be able to travel with it.  Aware of recommendations for very sparing use and only for severe anxiety/flights.  Xanax refilled and updated letter provided.  Procedures  performed this visit: None.  Return in about 6 months (around 09/26/2023) for ADHD follow up.  __________________________________ Thayer Ohm, DNP, APRN, FNP-BC Primary Care and Sports Medicine [facility]

## 2023-03-29 LAB — LIPID PANEL
Chol/HDL Ratio: 3.5 {ratio} (ref 0.0–5.0)
Cholesterol, Total: 145 mg/dL (ref 100–199)
HDL: 41 mg/dL (ref >39)
LDL Chol Calc (NIH): 87 mg/dL (ref 0–99)
Triglycerides: 87 mg/dL (ref 0–149)
VLDL Cholesterol Cal: 17 mg/dL (ref 5–40)

## 2023-04-02 ENCOUNTER — Ambulatory Visit: Payer: 59 | Admitting: Medical-Surgical

## 2023-06-14 ENCOUNTER — Other Ambulatory Visit (HOSPITAL_COMMUNITY): Payer: Self-pay

## 2023-06-14 ENCOUNTER — Other Ambulatory Visit: Payer: Self-pay | Admitting: Medical-Surgical

## 2023-06-14 DIAGNOSIS — E782 Mixed hyperlipidemia: Secondary | ICD-10-CM

## 2023-06-14 DIAGNOSIS — F909 Attention-deficit hyperactivity disorder, unspecified type: Secondary | ICD-10-CM

## 2023-06-14 MED ORDER — METHYLPHENIDATE HCL 10 MG PO TABS
10.0000 mg | ORAL_TABLET | Freq: Three times a day (TID) | ORAL | 0 refills | Status: DC | PRN
  Filled 2023-06-14: qty 90, 30d supply, fill #0

## 2023-06-14 MED ORDER — METHYLPHENIDATE HCL 10 MG PO TABS: 10.0000 mg | ORAL_TABLET | Freq: Three times a day (TID) | ORAL | 0 refills | Status: DC | PRN

## 2023-06-14 MED ORDER — ATORVASTATIN CALCIUM 20 MG PO TABS
20.0000 mg | ORAL_TABLET | Freq: Every day | ORAL | 3 refills | Status: DC
  Filled 2023-06-14: qty 90, 90d supply, fill #0

## 2023-06-14 NOTE — Telephone Encounter (Unsigned)
 Copied from CRM [id number]. Topic: Clinical - Medication Refill >> Jun 14, 2023  3:01 PM Leotis ORN wrote: Most Recent Primary Care Visit:  Provider: WILLO MINI  Department: [facility]  Visit Type: OFFICE VISIT  Date: 03/28/2023  Medication: atorvastatin  (LIPITOR) 20 MG tablet  methylphenidate  (RITALIN ) 10 MG tablet   Has the patient contacted their pharmacy? Yes (Agent: If no, request that the patient contact the pharmacy for the refill. If patient does not wish to contact the pharmacy document the reason why and proceed with request.) (Agent: If yes, when and what did the pharmacy advise?)  Is this the correct pharmacy for this prescription? Yes If no, delete pharmacy and type the correct one.  This is the patient's preferred pharmacy:   DELIVERY SERVICE  [place] [facility] [address] [place] [address] Phone: [phone] Fax: [phone]    Has the prescription been filled recently? Yes   Is the patient out of the medication? No  Has the patient been seen for an appointment in the last year OR does the patient have an upcoming appointment? Yes  Can we respond through MyChart? Yes  Agent: Please be advised that Rx refills may take up to 3 business days. We ask that you follow-up with your pharmacy.

## 2023-06-14 NOTE — Telephone Encounter (Signed)
 Copied from CRM [id number]. Topic: Clinical - Medication Refill >> Jun 14, 2023  3:01 PM Leotis ORN wrote: Most Recent Primary Care Visit:  Provider: WILLO MINI  Department: [facility]  Visit Type: OFFICE VISIT  Date: 03/28/2023  Medication: atorvastatin  (LIPITOR) 20 MG tablet  methylphenidate  (RITALIN ) 10 MG tablet   Has the patient contacted their pharmacy? Yes (Agent: If no, request that the patient contact the pharmacy for the refill. If patient does not wish to contact the pharmacy document the reason why and proceed with request.) (Agent: If yes, when and what did the pharmacy advise?)  Is this the correct pharmacy for this prescription? Yes If no, delete pharmacy and type the correct one.  This is the patient's preferred pharmacy:   DELIVERY SERVICE  [place] [facility] [address] [place] [address] Phone: [phone] Fax: [phone]    Has the prescription been filled recently? Yes   Is the patient out of the medication? No  Has the patient been seen for an appointment in the last year OR does the patient have an upcoming appointment? Yes  Can we respond through MyChart? Yes  Agent: Please be advised that Rx refills may take up to 3 business days. We ask that you follow-up with your pharmacy.

## 2023-06-15 ENCOUNTER — Other Ambulatory Visit (HOSPITAL_COMMUNITY): Payer: Self-pay

## 2023-06-17 ENCOUNTER — Other Ambulatory Visit: Payer: Self-pay

## 2023-09-21 DIAGNOSIS — Z419 Encounter for procedure for purposes other than remedying health state, unspecified: Secondary | ICD-10-CM | POA: Diagnosis not present

## 2023-09-25 NOTE — Progress Notes (Unsigned)
        Established patient visit  History, exam, impression, and plan:  No problem-specific Assessment & Plan notes found for this encounter.   ROS  Physical Exam  Procedures performed this visit: None.  No follow-ups on file.  __________________________________ Maryl Snook, DNP, APRN, FNP-BC Primary Care and Sports Medicine [facility]

## 2023-09-26 ENCOUNTER — Encounter: Payer: Self-pay | Admitting: Medical-Surgical

## 2023-09-26 ENCOUNTER — Ambulatory Visit (INDEPENDENT_AMBULATORY_CARE_PROVIDER_SITE_OTHER): Payer: 59 | Admitting: Medical-Surgical

## 2023-09-26 VITALS — BP 103/68 | HR 70 | Resp 20 | Ht 74.0 in | Wt 194.0 lb

## 2023-09-26 DIAGNOSIS — F909 Attention-deficit hyperactivity disorder, unspecified type: Secondary | ICD-10-CM

## 2023-09-26 DIAGNOSIS — E782 Mixed hyperlipidemia: Secondary | ICD-10-CM

## 2023-09-26 DIAGNOSIS — Z0000 Encounter for general adult medical examination without abnormal findings: Secondary | ICD-10-CM

## 2023-09-26 MED ORDER — METHYLPHENIDATE HCL 10 MG PO TABS: 10.0000 mg | ORAL_TABLET | Freq: Three times a day (TID) | ORAL | 0 refills | Status: DC | PRN

## 2023-09-26 MED ORDER — ATORVASTATIN CALCIUM 20 MG PO TABS: 20.0000 mg | ORAL_TABLET | Freq: Every day | ORAL | 3 refills | Status: DC

## 2023-09-27 LAB — CMP14+EGFR
ALT: 22 IU/L (ref 0–44)
AST: 25 IU/L (ref 0–40)
Albumin: 4.7 g/dL (ref 4.1–5.1)
Alkaline Phosphatase: 70 IU/L (ref 44–121)
BUN/Creatinine Ratio: 21 — ABNORMAL HIGH (ref 9–20)
BUN: 26 mg/dL — ABNORMAL HIGH (ref 6–24)
Bilirubin Total: 0.6 mg/dL (ref 0.0–1.2)
CO2: 25 mmol/L (ref 20–29)
Calcium: 9.8 mg/dL (ref 8.7–10.2)
Chloride: 101 mmol/L (ref 96–106)
Creatinine, Ser: 1.26 mg/dL (ref 0.76–1.27)
Globulin, Total: 2.5 g/dL (ref 1.5–4.5)
Glucose: 81 mg/dL (ref 70–99)
Potassium: 4.7 mmol/L (ref 3.5–5.2)
Sodium: 139 mmol/L (ref 134–144)
Total Protein: 7.2 g/dL (ref 6.0–8.5)
eGFR: 73 mL/min/{1.73_m2} (ref >59)

## 2023-09-27 LAB — LIPID PANEL
Chol/HDL Ratio: 5.2 ratio — ABNORMAL HIGH (ref 0.0–5.0)
Cholesterol, Total: 214 mg/dL — ABNORMAL HIGH (ref 100–199)
HDL: 41 mg/dL (ref >39)
LDL Chol Calc (NIH): 145 mg/dL — ABNORMAL HIGH (ref 0–99)
Triglycerides: 153 mg/dL — ABNORMAL HIGH (ref 0–149)
VLDL Cholesterol Cal: 28 mg/dL (ref 5–40)

## 2023-09-27 LAB — CBC WITH DIFFERENTIAL/PLATELET
Basophils Absolute: 0.1 10*3/uL (ref 0.0–0.2)
Basos: 1 %
EOS (ABSOLUTE): 0.3 10*3/uL (ref 0.0–0.4)
Eos: 5 %
Hematocrit: 48.3 % (ref 37.5–51.0)
Hemoglobin: 16.2 g/dL (ref 13.0–17.7)
Immature Grans (Abs): 0 10*3/uL (ref 0.0–0.1)
Immature Granulocytes: 0 %
Lymphocytes Absolute: 1.9 10*3/uL (ref 0.7–3.1)
Lymphs: 28 %
MCH: 29.2 pg (ref 26.6–33.0)
MCHC: 33.5 g/dL (ref 31.5–35.7)
MCV: 87 fL (ref 79–97)
Monocytes Absolute: 0.5 10*3/uL (ref 0.1–0.9)
Monocytes: 7 %
Neutrophils Absolute: 4.2 10*3/uL (ref 1.4–7.0)
Neutrophils: 59 %
Platelets: 204 10*3/uL (ref 150–450)
RBC: 5.54 x10E6/uL (ref 4.14–5.80)
RDW: 13 % (ref 11.6–15.4)
WBC: 6.9 10*3/uL (ref 3.4–10.8)

## 2023-09-30 ENCOUNTER — Encounter: Payer: Self-pay | Admitting: Medical-Surgical

## 2023-10-21 DIAGNOSIS — Z419 Encounter for procedure for purposes other than remedying health state, unspecified: Secondary | ICD-10-CM | POA: Diagnosis not present

## 2023-11-21 DIAGNOSIS — Z419 Encounter for procedure for purposes other than remedying health state, unspecified: Secondary | ICD-10-CM | POA: Diagnosis not present

## 2023-12-21 DIAGNOSIS — Z419 Encounter for procedure for purposes other than remedying health state, unspecified: Secondary | ICD-10-CM | POA: Diagnosis not present

## 2024-01-21 DIAGNOSIS — Z419 Encounter for procedure for purposes other than remedying health state, unspecified: Secondary | ICD-10-CM | POA: Diagnosis not present

## 2024-02-21 DIAGNOSIS — Z419 Encounter for procedure for purposes other than remedying health state, unspecified: Secondary | ICD-10-CM | POA: Diagnosis not present

## 2024-03-27 ENCOUNTER — Ambulatory Visit (INDEPENDENT_AMBULATORY_CARE_PROVIDER_SITE_OTHER): Admitting: Medical-Surgical

## 2024-03-27 ENCOUNTER — Encounter: Payer: Self-pay | Admitting: Medical-Surgical

## 2024-03-27 VITALS — BP 107/70 | HR 63 | Resp 20 | Ht 74.0 in | Wt 197.0 lb

## 2024-03-27 DIAGNOSIS — Z23 Encounter for immunization: Secondary | ICD-10-CM

## 2024-03-27 DIAGNOSIS — F909 Attention-deficit hyperactivity disorder, unspecified type: Secondary | ICD-10-CM

## 2024-03-27 DIAGNOSIS — R5383 Other fatigue: Secondary | ICD-10-CM | POA: Diagnosis not present

## 2024-03-27 DIAGNOSIS — R1011 Right upper quadrant pain: Secondary | ICD-10-CM

## 2024-03-27 MED ORDER — LISDEXAMFETAMINE DIMESYLATE 30 MG PO CAPS: 30.0000 mg | ORAL_CAPSULE | Freq: Every day | ORAL | 0 refills | Status: DC

## 2024-03-27 NOTE — Progress Notes (Signed)
        Established patient visit   History of Present Illness   Discussed the use of AI scribe software for clinical note transcription with the patient, who gave verbal consent to proceed.  History of Present Illness   Jeremy Peck is a 43 year old male who presents for a follow-up regarding medication management for ADHD.  Attention deficit hyperactivity disorder symptoms and medication management - Takes Ritalin  10 mg twice daily, occasionally three times daily - Effects of Ritalin  are short-lived, resulting in fluctuations in symptom control - Difficulty remembering multiple doses due to a busy schedule - Previously used Adderall, which was effective but caused more side effects - Interested in a once-daily medication for more consistent symptom control - Experiences headaches when Ritalin  wears off, especially with a third dose - No sleep disturbances, palpitations, or chest pain  Right upper quadrant abdominal pain - Intermittent right upper quadrant abdominal pain, more frequent at night - No specific triggers identified - No nausea, vomiting, or changes in bowel habits - Gallbladder intact - Tums have not provided relief  Fatigue - Experiences fatigue, noticeable due to an active lifestyle - Engages in regular weightlifting three times a week - No respiratory symptoms or pain with movement     Physical Exam   Physical Exam Assessment & Plan     Right upper quadrant abdominal pain Intermittent pain with differential including liver, gallbladder, musculoskeletal, and gastrointestinal causes. Previous labs showed normal liver function. - Order blood work for liver function, amylase, lipase, and blood counts. - Consider ultrasound if blood work is inconclusive.  Hypercholesterolemia Poorly controlled cholesterol with potential fatty liver disease contribution to pain. Has been back on Atorvastatin  and is compliant with dosing. - Advise dietary  modifications to reduce greasy and fatty food intake. - Continue Atorvastatin .   Attention-deficit hyperactivity disorder (ADHD), predominantly inattentive type Current Ritalin  treatment causes focus fluctuations and headaches. Discussed extended-release options, preferring Vyvanse for better efficacy. - Prescribe Vyvanse 30 mg once daily for 30 days. - Monitor response and side effects. Follow up in 4 weeks, virtually if preferred.  Fatigue Persistent fatigue with consideration of nutritional deficiencies. Previous labs showed elevated BUN likely due to exercise. - Include iron and B12 evaluation in blood work.     Follow up   Return in about 4 weeks (around 04/24/2024) for ADHD follow up. __________________________________ Zada FREDRIK Palin, DNP, APRN, FNP-BC Primary Care and Sports Medicine [facility] [place]

## 2024-03-28 LAB — CMP14+EGFR
ALT: 23 IU/L (ref 0–44)
AST: 19 IU/L (ref 0–40)
Albumin: 4.8 g/dL (ref 4.1–5.1)
Alkaline Phosphatase: 68 IU/L (ref 47–123)
BUN/Creatinine Ratio: 14 (ref 9–20)
BUN: 18 mg/dL (ref 6–24)
Bilirubin Total: 0.8 mg/dL (ref 0.0–1.2)
CO2: 26 mmol/L (ref 20–29)
Calcium: 9.2 mg/dL (ref 8.7–10.2)
Chloride: 101 mmol/L (ref 96–106)
Creatinine, Ser: 1.29 mg/dL — ABNORMAL HIGH (ref 0.76–1.27)
Globulin, Total: 2.6 g/dL (ref 1.5–4.5)
Glucose: 90 mg/dL (ref 70–99)
Potassium: 4.8 mmol/L (ref 3.5–5.2)
Sodium: 140 mmol/L (ref 134–144)
Total Protein: 7.4 g/dL (ref 6.0–8.5)
eGFR: 71 mL/min/1.73 (ref >59)

## 2024-03-28 LAB — IRON,TIBC AND FERRITIN PANEL
Ferritin: 156 ng/mL (ref 30–400)
Iron Saturation: 29 % (ref 15–55)
Iron: 90 ug/dL (ref 38–169)
Total Iron Binding Capacity: 311 ug/dL (ref 250–450)
UIBC: 221 ug/dL (ref 111–343)

## 2024-03-28 LAB — CBC
Hematocrit: 48.6 % (ref 37.5–51.0)
Hemoglobin: 16.2 g/dL (ref 13.0–17.7)
MCH: 29.1 pg (ref 26.6–33.0)
MCHC: 33.3 g/dL (ref 31.5–35.7)
MCV: 87 fL (ref 79–97)
Platelets: 214 x10E3/uL (ref 150–450)
RBC: 5.57 x10E6/uL (ref 4.14–5.80)
RDW: 13.3 % (ref 11.6–15.4)
WBC: 7.5 x10E3/uL (ref 3.4–10.8)

## 2024-03-28 LAB — AMYLASE: Amylase: 56 U/L (ref 31–110)

## 2024-03-28 LAB — TSH: TSH: 2.09 u[IU]/mL (ref 0.450–4.500)

## 2024-03-28 LAB — LIPASE: Lipase: 48 U/L (ref 13–78)

## 2024-03-28 LAB — VITAMIN B12: Vitamin B-12: 1225 pg/mL (ref 232–1245)

## 2024-03-30 ENCOUNTER — Ambulatory Visit: Payer: Self-pay | Admitting: Medical-Surgical

## 2024-05-01 ENCOUNTER — Encounter: Payer: Self-pay | Admitting: Medical-Surgical

## 2024-05-01 ENCOUNTER — Telehealth: Admitting: Medical-Surgical

## 2024-05-01 DIAGNOSIS — F909 Attention-deficit hyperactivity disorder, unspecified type: Secondary | ICD-10-CM

## 2024-05-01 DIAGNOSIS — E782 Mixed hyperlipidemia: Secondary | ICD-10-CM

## 2024-05-01 MED ORDER — LISDEXAMFETAMINE DIMESYLATE 40 MG PO CAPS: 40.0000 mg | ORAL_CAPSULE | Freq: Every day | ORAL | 0 refills | Status: DC

## 2024-05-01 MED ORDER — ATORVASTATIN CALCIUM 20 MG PO TABS: 20.0000 mg | ORAL_TABLET | Freq: Every day | ORAL | 3 refills | Status: AC

## 2024-05-01 NOTE — Progress Notes (Signed)
 Virtual Visit via Video Note  I connected with Jeremy Peck on 05/01/24 at  9:10 AM EST by a video enabled telemedicine application and verified that I am speaking with the correct person using two identifiers.   I discussed the limitations of evaluation and management by telemedicine and the availability of in person appointments. The patient expressed understanding and agreed to proceed.  Patient location: home Provider locations: office  Subjective:    Discussed the use of AI scribe software for clinical note transcription with the patient, who gave verbal consent to proceed.  History of Present Illness   Jeremy Peck is a 43 year old male who presents for follow-up regarding his ADHD medication management.  Attention deficit hyperactivity disorder (adhd) medication management - Previously prescribed Ritalin  which caused headachs - Switched to Vyvanse  30 mg daily about 4 weeks ago - Vyvanse  causes fewer side effects compared to previous treatment with Ritalin  - Effect of Vyvanse  wears off by early afternoon, around 2-3 PM - Prefers once-daily dosing regimen  Medication side effects - Slight appetite suppression managed with a consistent eating schedule - No significant issues with sleep - No other notable side effects     Past medical history, Surgical history, Family history not pertinant except as noted below, Social history, Allergies, and medications have been entered into the medical record, reviewed, and corrections made.   Review of Systems: See HPI for pertinent positives and negatives.   Objective:    General: Speaking clearly in complete sentences without any shortness of breath.  Alert and oriented x3.  Normal judgment. No apparent acute distress.  Impression and Recommendations:    Attention-deficit hyperactivity disorder (ADHD) ADHD managed with Vyvanse  30 mg, effective but wears off by 2-3 PM. Minimal side effects, prefers once-daily dosing. -  Increased Vyvanse  to 40 mg daily. - Advised to send an iChart message after a few weeks to report on efficacy and side effects.    I discussed the assessment and treatment plan with the patient. The patient was provided an opportunity to ask questions and all were answered. The patient agreed with the plan and demonstrated an understanding of the instructions.   The patient was advised to call back or seek an in-person evaluation if the symptoms worsen or if the condition fails to improve as anticipated.  Return in about 3 months (around 08/01/2024) for ADHD follow up.  Zada FREDRIK Palin, DNP, APRN, FNP-BC [facility] and Sports Medicine

## 2024-05-21 ENCOUNTER — Encounter: Payer: Self-pay | Admitting: Medical-Surgical

## 2024-05-21 NOTE — Telephone Encounter (Signed)
 Requesting rx rf of Vyvanse  40mg   Last written 05/01/2024 Last OV - telemedicien 05/01/2024 Upcoming appt = none

## 2024-05-22 DIAGNOSIS — Z419 Encounter for procedure for purposes other than remedying health state, unspecified: Secondary | ICD-10-CM | POA: Diagnosis not present

## 2024-05-22 MED ORDER — LISDEXAMFETAMINE DIMESYLATE 40 MG PO CAPS: 40.0000 mg | ORAL_CAPSULE | Freq: Every day | ORAL | 0 refills | Status: DC

## 2024-05-29 ENCOUNTER — Other Ambulatory Visit (HOSPITAL_COMMUNITY): Payer: Self-pay

## 2024-05-29 ENCOUNTER — Telehealth: Payer: Self-pay | Admitting: Pharmacy Technician

## 2024-05-29 MED ORDER — VYVANSE 40 MG PO CAPS: 40.0000 mg | ORAL_CAPSULE | ORAL | 0 refills | Status: AC

## 2024-05-29 NOTE — Addendum Note (Signed)
 Addended byBETHA WILLO MINI on: 05/29/2024 09:09 PM   Modules accepted: Orders

## 2024-05-29 NOTE — Telephone Encounter (Signed)
 Per CPhT Dania - BRAND NAME VYVANSE  is preferred by the insurance. If appropriate, please send in a new rx for brand to the pharmacy. Thanks in advance.

## 2024-05-29 NOTE — Telephone Encounter (Signed)
 Pharmacy Patient Advocate Encounter   Received notification from Onbase that prior authorization for Lisdexamfetamine  Dimesylate 40MG  capsules is required/requested.   Insurance verification completed.   The patient is insured through [facility] MEDICAID.   Per test claim:  BRAND NAME VYVANSE  is preferred by the insurance.  If suggested medication is appropriate, Please send in a new RX and discontinue this one. If not, please advise as to why it's not appropriate so that we may request a Prior Authorization. Please note, some preferred medications may still require a PA.  If the suggested medications have not been trialed and there are no contraindications to their use, the PA will not be submitted, as it will not be approved.

## 2024-06-01 NOTE — Telephone Encounter (Signed)
 The rx has been updated to dispense vyvanse  as DAW.

## 2024-06-29 ENCOUNTER — Telehealth: Admitting: Physician Assistant

## 2024-06-29 DIAGNOSIS — J069 Acute upper respiratory infection, unspecified: Secondary | ICD-10-CM | POA: Diagnosis not present

## 2024-06-29 MED ORDER — PSEUDOEPH-BROMPHEN-DM 30-2-10 MG/5ML PO SYRP: 10.0000 mL | ORAL_SOLUTION | Freq: Four times a day (QID) | ORAL | 0 refills | Status: AC | PRN

## 2024-06-29 NOTE — Progress Notes (Signed)
 " Virtual Visit Consent   Jeremy Peck, you are scheduled for a virtual visit with a [facility] provider today. Just as with appointments in the office, your consent must be obtained to participate. Your consent will be active for this visit and any virtual visit you may have with one of our providers in the next 365 days. If you have a MyChart account, a copy of this consent can be sent to you electronically.  As this is a virtual visit, video technology does not allow for your provider to perform a traditional examination. This may limit your provider's ability to fully assess your condition. If your provider identifies any concerns that need to be evaluated in person or the need to arrange testing (such as labs, EKG, etc.), we will make arrangements to do so. Although advances in technology are sophisticated, we cannot ensure that it will always work on either your end or our end. If the connection with a video visit is poor, the visit may have to be switched to a telephone visit. With either a video or telephone visit, we are not always able to ensure that we have a secure connection.  By engaging in this virtual visit, you consent to the provision of healthcare and authorize for your insurance to be billed (if applicable) for the services provided during this visit. Depending on your insurance coverage, you may receive a charge related to this service.  I need to obtain your verbal consent now. Are you willing to proceed with your visit today? CORDARO MUKAI has provided verbal consent on 06/29/2024 for a virtual visit (video or telephone). Jeremy Peck, [place]  Date: 06/29/2024 11:30 AM   Virtual Visit via Video Note   I, Jeremy Peck, connected with  Jeremy Peck  ([id number], 05-19-1981) on 06/29/24 at 11:30 AM EST by a video-enabled telemedicine application and verified that I am speaking with the correct person using two identifiers.  Location: Patient: Virtual Visit Location Patient:  Home Provider: Virtual Visit Location Provider: Home Office   I discussed the limitations of evaluation and management by telemedicine and the availability of in person appointments. The patient expressed understanding and agreed to proceed.    History of Present Illness: Jeremy Peck is a 44 y.o. who identifies as a male who was assigned male at birth, and is being seen today for ongoing cough and nasal drainage after flu.  HPI: URI  The current episode started in the past 7 days. The problem has been waxing and waning. There has been Peck fever. Associated symptoms include congestion, coughing and rhinorrhea. The treatment provided mild relief.    Problems:  Patient Active Problem List   Diagnosis Date Noted   Raynauds phenomenon 10/01/2022   Mid back pain on right side 07/20/2021   White coat syndrome without diagnosis of hypertension 03/26/2018   Metatarsalgia, left foot 03/26/2018   Mixed hyperlipidemia 08/09/2017   Adult ADHD 07/01/2017   Flying phobia 07/01/2017   Social anxiety disorder 07/01/2017    Allergies: Allergies[1] Medications: Current Medications[2]  Observations/Objective: Patient is well-developed, well-nourished in Peck acute distress.  Resting comfortably  at home.  Head is normocephalic, atraumatic.  Peck labored breathing.  Speech is clear and coherent with logical content.  Patient is alert and oriented at baseline.    Assessment and Plan: 1. Upper respiratory tract infection, unspecified type (Primary)  Patient presenting with URI. Differentials include allergic rhinitis, COVID,  bacterial pneumonia, sinusitis. Do not suspect underlying cardiopulmonary process.  I considered, but think unlikely, dangerous causes of this patient's symptoms to include ACS, CHF or pneumonia, pneumothorax. Patient is nontoxic and not in need of emergent medical intervention. Plan: reassurance, reassessment, discharge with PCP follow-up  Follow Up Instructions: I discussed  the assessment and treatment plan with the patient. The patient was provided an opportunity to ask questions and all were answered. The patient agreed with the plan and demonstrated an understanding of the instructions.  A copy of instructions were sent to the patient via MyChart unless otherwise noted below.    The patient was advised to call back or seek an in-person evaluation if the symptoms worsen or if the condition fails to improve as anticipated.    Kamera Dubas, PA-C    [1] Peck Known Allergies [2]  Current Outpatient Medications:    ALPRAZolam  (XANAX ) 0.5 MG tablet, Take 1-2 tablets (0.5-1 mg total) by mouth 2 (two) times daily as needed for anxiety. Please use very sparingly and only for severe anxiety.  Do not use for sleep!, Disp: 20 tablet, Rfl: 0   atorvastatin  (LIPITOR) 20 MG tablet, Take 1 tablet (20 mg total) by mouth daily at 6 PM., Disp: 90 tablet, Rfl: 3   Cholecalciferol 125 MCG (5000 UT) capsule, Take 5,000 Units by mouth daily., Disp: , Rfl:    CREATINE PO, Take by mouth., Disp: , Rfl:    VYVANSE  40 MG capsule, Take 1 capsule (40 mg total) by mouth every morning., Disp: 30 capsule, Rfl: 0  "

## 2024-06-29 NOTE — Patient Instructions (Signed)
" °  Jeremy KANDICE Claude, thank you for joining [name], PA-C for today's virtual visit.  While this provider is not your primary care provider (PCP), if your PCP is located in our provider database this encounter information will be shared with them immediately following your visit.   A [facility] MyChart account gives you access to today's visit and all your visits, tests, and labs performed at [facility]  click here if you don't have a [facility] MyChart account or go to [email or web address]  Consent: (Patient) Jeremy KANDICE Claude provided verbal consent for this virtual visit at the beginning of the encounter.  Current Medications:  Current Outpatient Medications:    ALPRAZolam  (XANAX ) 0.5 MG tablet, Take 1-2 tablets (0.5-1 mg total) by mouth 2 (two) times daily as needed for anxiety. Please use very sparingly and only for severe anxiety.  Do not use for sleep!, Disp: 20 tablet, Rfl: 0   atorvastatin  (LIPITOR) 20 MG tablet, Take 1 tablet (20 mg total) by mouth daily at 6 PM., Disp: 90 tablet, Rfl: 3   Cholecalciferol 125 MCG (5000 UT) capsule, Take 5,000 Units by mouth daily., Disp: , Rfl:    CREATINE PO, Take by mouth., Disp: , Rfl:    VYVANSE  40 MG capsule, Take 1 capsule (40 mg total) by mouth every morning., Disp: 30 capsule, Rfl: 0   Medications ordered in this encounter:  No orders of the defined types were placed in this encounter.    *If you need refills on other medications prior to your next appointment, please contact your pharmacy*  Follow-Up: Call back or seek an in-person evaluation if the symptoms worsen or if the condition fails to improve as anticipated.  [facility] [phone]  Other Instructions Follow up with primary provider in 24-48 hours. Report to nearest ER with any worsening symptoms.    If you have been instructed to have an in-person evaluation today at a local Urgent Care facility, please use the link below. It will take  you to a list of all of our available [facility], including address, phone number and hours of operation. Please do not delay care.  [facility]  If you or a family member do not have a primary care provider, use the link below to schedule a visit and establish care. When you choose a [facility] primary care physician or advanced practice provider, you gain a long-term partner in health. Find a Primary Care Provider  Learn more about [facility]'s in-office and virtual care options: [facility] - Get Care Now  "

## 2024-07-29 ENCOUNTER — Ambulatory Visit: Admitting: Medical-Surgical
